# Patient Record
Sex: Male | Born: 1965 | Hispanic: No | Marital: Married | Smoking: Never smoker
Health system: Southern US, Community
[De-identification: ages and names within clinical notes are randomized; demographics above are authoritative.]

## PROBLEM LIST (undated history)

## (undated) DIAGNOSIS — Z973 Presence of spectacles and contact lenses: Secondary | ICD-10-CM

## (undated) DIAGNOSIS — G4733 Obstructive sleep apnea (adult) (pediatric): Secondary | ICD-10-CM

## (undated) DIAGNOSIS — E785 Hyperlipidemia, unspecified: Secondary | ICD-10-CM

## (undated) DIAGNOSIS — I10 Essential (primary) hypertension: Secondary | ICD-10-CM

## (undated) HISTORY — DX: Obstructive sleep apnea (adult) (pediatric): G47.33

## (undated) HISTORY — DX: Hyperlipidemia, unspecified: E78.5

## (undated) HISTORY — PX: BACK SURGERY: SHX140

## (undated) HISTORY — PX: UMBILICAL HERNIA REPAIR: SHX196

---
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
## 2002-01-18 ENCOUNTER — Ambulatory Visit (HOSPITAL_COMMUNITY): Admission: RE | Admit: 2002-01-18 | Discharge: 2002-01-18 | Payer: Self-pay | Admitting: Internal Medicine

## 2002-01-18 ENCOUNTER — Encounter: Payer: Self-pay | Admitting: Internal Medicine

## 2003-10-22 ENCOUNTER — Emergency Department (HOSPITAL_COMMUNITY): Admission: EM | Admit: 2003-10-22 | Discharge: 2003-10-22 | Payer: Self-pay | Admitting: Emergency Medicine

## 2005-01-20 ENCOUNTER — Encounter: Payer: Self-pay | Admitting: Pulmonary Disease

## 2005-01-20 ENCOUNTER — Ambulatory Visit: Admission: RE | Admit: 2005-01-20 | Discharge: 2005-01-20 | Payer: Self-pay | Admitting: Internal Medicine

## 2005-01-24 ENCOUNTER — Ambulatory Visit: Payer: Self-pay | Admitting: Pulmonary Disease

## 2005-03-06 ENCOUNTER — Ambulatory Visit: Payer: Self-pay | Admitting: Pulmonary Disease

## 2005-04-03 ENCOUNTER — Ambulatory Visit: Payer: Self-pay | Admitting: Pulmonary Disease

## 2010-03-13 ENCOUNTER — Encounter: Payer: Self-pay | Admitting: Pulmonary Disease

## 2010-04-04 ENCOUNTER — Ambulatory Visit: Payer: Self-pay | Admitting: Pulmonary Disease

## 2010-04-04 DIAGNOSIS — Z9989 Dependence on other enabling machines and devices: Secondary | ICD-10-CM | POA: Insufficient documentation

## 2010-04-04 DIAGNOSIS — G4733 Obstructive sleep apnea (adult) (pediatric): Secondary | ICD-10-CM | POA: Insufficient documentation

## 2010-04-04 DIAGNOSIS — E785 Hyperlipidemia, unspecified: Secondary | ICD-10-CM | POA: Insufficient documentation

## 2010-04-09 ENCOUNTER — Telehealth (INDEPENDENT_AMBULATORY_CARE_PROVIDER_SITE_OTHER): Payer: Self-pay | Admitting: *Deleted

## 2010-09-18 NOTE — Assessment & Plan Note (Signed)
Summary: consult for management of osa   Copy to:  Alan Armstrong Primary Provider/Referring Provider:  Carylon Armstrong  CC:  Sleep Consult.  History of Present Illness: The pt is a 45y/o male who comes in for management of osa.  I last saw the pt in 2006, where he was diagnosed with severe osa.  He had an AHI of 41/hr, and desat to 81%.  He was ultimately titrated to an optimal pressure of 12cm.  He has done very well since his last visit, and his compliance is excellent by download.  He denies any mask or pressure problems, and has kept up with his supplies and mask changes.  His bedpartner has not seen breakthru snoring or apneas.  His AHI was well controlled by his download as well.  He goes to bed at 10pm, and arises at 5:30 am to start his day.  He feels rested upon arising.  He has rare sleep pressure during the day with periods of inactivity, and is satisfied with his alertness and concentration with mundane tasks.  He denies any sleepiness while driving short or long distances.  His epworth score today is only 5.  Preventive Screening-Counseling & Management  Alcohol-Tobacco     Smoking Status: never  Current Medications (verified): 1)  Tricor 145 Mg Tabs (Fenofibrate) .... Take 1 Tablet By Mouth Once A Day  Allergies (verified): No Known Drug Allergies  Past History:  Past Medical History:   HYPERLIPIDEMIA (ICD-272.4) OBSTRUCTIVE SLEEP APNEA (ICD-327.23)--AHI 41/hr 2006    Past Surgical History: L arm surgery   Family History: Reviewed history and no changes required. none per pt  Social History: Reviewed history and no changes required. Patient never smoked.  pt is married and lives with wife Alan Armstrong pt does not have any children. Pt works as a Education officer, environmental. Smoking Status:  never  Review of Systems  The patient denies shortness of breath with activity, shortness of breath at rest, productive cough, non-productive cough, coughing up blood, chest pain,  irregular heartbeats, acid heartburn, indigestion, loss of appetite, weight change, abdominal pain, difficulty swallowing, sore throat, tooth/dental problems, headaches, nasal congestion/difficulty breathing through nose, sneezing, itching, ear ache, anxiety, depression, hand/feet swelling, joint stiffness or pain, rash, change in color of mucus, and fever.    Vital Signs:  Patient profile:   45 year old male Height:      68 inches Weight:      303.13 pounds BMI:     46.26 O2 Sat:      97 % on Room air Temp:     98.4 degrees F oral Pulse rate:   55 / minute BP sitting:   110 / 70  (left arm) Cuff size:   large  Vitals Entered By: Arman Filter LPN (April 04, 2010 11:25 AM)  O2 Flow:  Room air CC: Sleep Consult Comments Medications reviewed with patient Arman Filter LPN  April 04, 2010 11:25 AM    Physical Exam  General:  obese male in nad Eyes:  PERRLA and EOMI.   Nose:  mild septal deviation to left with no obstruction Mouth:  moderate elongation of soft palate and uvula, side wall narrowing due to soft tissue redundancy Neck:  no jvd, tmg, LN Lungs:  clear to auscultation Heart:  rrr, no mrg Abdomen:  soft and nontender, bs+ Extremities:  no edema or cyanosis  pulses intact distally. Neurologic:  alert and oriented, moves all 4.   Impression & Recommendations:  Problem # 1:  OBSTRUCTIVE SLEEP APNEA (ICD-327.23)  the pt has a history of severe osa, but is doing very well with cpap on optimal pressure.  He has documented compliance with the device, and no significant breakthru events.  He feels that he sleeps well, and denies alertness issues during the day.  His epworth score today is only 5.  I have encouraged him to work on weight loss, and to keep up with cpap supplies.  I will see him back in one year.    Medications Added to Medication List This Visit: 1)  Tricor 145 Mg Tabs (Fenofibrate) .... Take 1 tablet by mouth once a day  Other Orders: Consultation  Level IV (14782)  Patient Instructions: 1)  will send note to the [employer] outlining your condition. 2)  continue with cpap and work hard on weight loss 3)  followup with me in one year.

## 2010-09-18 NOTE — Consult Note (Signed)
Summary: Sleep Medicine  Sleep Medicine   Imported By: Lester Iola 04/10/2010 07:15:58  _____________________________________________________________________  External Attachment:    Type:   Image     Comment:   External Document

## 2010-09-18 NOTE — Letter (Signed)
Summary: [place] [employer]  [place] [employer]   Imported By: Sherian Rein 04/18/2010 12:08:52  _____________________________________________________________________  External Attachment:    Type:   Image     Comment:   External Document

## 2010-09-18 NOTE — Progress Notes (Signed)
Summary: letter for [employer] Note Call from Patient Call back at Home Phone [phone] Call back at [phone]   Caller: Patient Call For: clance Reason for Call: Talk to Nurse, Talk to Doctor Summary of Call: pt said [facility] was going to write him a letter for the [facility] re: his sleep apnea.  Have you finished it yet?(letter)?  If you have letter you can fax to [phone]. Initial call taken by: Eugene Gavia,  April 09, 2010 1:42 PM  Follow-up for Phone Call        Dr. Shelle Iron, per last OV note on 8.17.11, we were going to send letter to [facility] gaurd outlining pt's condition.  Pls advise status of this letter.  Thanks! Gweneth Dimitri RN  April 09, 2010 1:46 PM   Additional Follow-up for Phone Call Additional follow up Details #1::        this has been done.  megan, please call pt to come by and pick up.  needs to sign release of info Additional Follow-up by: Barbaraann Share MD,  April 10, 2010 10:02 AM    Additional Follow-up for Phone Call Additional follow up Details #2::    pt aware letter ready and to come to the office to pick up letter (which is sitting on my desk)  and sign a release of info.  Aundra Millet Reynolds LPN  April 10, 2010 10:15 AM

## 2011-01-04 NOTE — Consult Note (Signed)
NAME:  [name], REASON NO.:  [id number]   MEDICAL RECORD NO.:  [id number]                   PATIENT TYPE:  EMS   LOCATION:  ED                                   FACILITY:  [facility]   PHYSICIAN:  Alan Armstrong, M.D.              DATE OF BIRTH:  1965/12/24   DATE OF CONSULTATION:  DATE OF DISCHARGE:  10/22/2003                                   CONSULTATION   CONSULTING PHYSICIAN:  Alan Armstrong, M.D.   CHIEF COMPLAINT:  I hurt my ankle.   The patient twisted his right ankle approximately 8-10 days ago while at  work.  He has had pain and tenderness.  He thought it was getting better, it  slowly did, but then he developed some swelling proximal to the ankle  laterally.  It has gotten a lot worse in the last day or two and it was  very, very painful this morning.  He came to the emergency room.  X-rays  were taken.  These were negative.   PHYSICAL EXAMINATION:  EXTREMITIES:  He has got some slight swelling in an  area the size of approximately a nickel, an inch and a half above the medial  malleolus.  He has got pain and tenderness in the anterior talofibular  ligament and in this area.  There is no erythema, no purulence.  VITAL SIGNS:  He is afebrile.   X-rays are negative.   IMPRESSION:  Strained right ankle with some swelling, edema.   PLAN:  Aircast, contrast baths.  I will see him in the office on Thursday.  Prescription for Vicodin 5/500 given.  Told him to take Advil three tablets  three times a day.  Any difficulty call.      ___________________________________________                                            Teola Bradley, M.D.   JWK/MEDQ  D:  10/22/2003  T:  10/22/2003  Job:  [id number]

## 2011-01-04 NOTE — Procedures (Signed)
NAME:  ARICK, MARENO NO.:  [id number]   MEDICAL RECORD NO.:  [id number]          PATIENT TYPE:  OUT   LOCATION:  SLEEP LAB                     FACILITY:  [facility]   PHYSICIAN:  Marcelyn Bruins, M.D. [facility] DATE OF BIRTH:  [birth year]   DATE OF STUDY:  01/20/2005                              NOCTURNAL POLYSOMNOGRAM   REFERRING PHYSICIAN:  Kingsley Callander. Ouida Sills, M.D.   INDICATION FOR THE STUDY:  Hypersomnia with sleep apnea. Epworth score: 15.   SLEEP ARCHITECTURE:  The patient a total sleep time of 364 minutes with  decreased REM and slow wave sleep. Sleep onset latency was normal as was REM  onset. Sleep efficiency was 87%.   IMPRESSION:  1.  Severe obstructive sleep apnea/hypopnea syndrome with a respiratory      disturbance index of 41 events per hour and O2 desaturation as low as      81%. The events were clearly worse in the supine position and were      associated with very loud snoring. Treatment for this degree of sleep      apnea may include weight loss, upper airway surgery, oral appliance or      CPAP. CPAP coupled with weight loss is probably the most effective      treatment for this degree of sleep apnea.  2.  No clinically significant cardiac arrhythmias.     ______________________________  Suzzette Righter    KC/MEDQ  D:  01/24/2005 11:26:12  T:  01/24/2005 11:56:28  Job:  [id number]

## 2011-04-03 ENCOUNTER — Ambulatory Visit (INDEPENDENT_AMBULATORY_CARE_PROVIDER_SITE_OTHER): Payer: 59 | Admitting: Pulmonary Disease

## 2011-04-03 ENCOUNTER — Encounter: Payer: Self-pay | Admitting: Pulmonary Disease

## 2011-04-03 VITALS — BP 122/84 | HR 67 | Temp 98.1°F | Ht 68.0 in | Wt 313.0 lb

## 2011-04-03 DIAGNOSIS — G4733 Obstructive sleep apnea (adult) (pediatric): Secondary | ICD-10-CM

## 2011-04-03 NOTE — Progress Notes (Signed)
  Subjective:    Patient ID: Alan Armstrong, male    DOB: 10-30-1965, 45 y.o.   MRN: [id number]  HPI The patient comes in today for followup of his known severe sleep apnea.  He is being managed on CPAP therapy, and feels that he is doing well with the device.  He has no issues with pressure tolerance, but is having some leaking from a mask that is old.  He is due for a replacement.  Overall he feels that he is sleeping well with adequate daytime alertness, but does not feel as rested as he has in the past.  I have explained this may be from mask leaking.  He also has gained 10 pounds since the last visit.  Download from his CPAP machine today shows a 95% compliance greater than or equal to 4 hours.   Review of Systems  Constitutional: Negative for fever and unexpected weight change.  HENT: Negative for ear pain, nosebleeds, congestion, sore throat, rhinorrhea, sneezing, trouble swallowing, dental problem, postnasal drip and sinus pressure.   Eyes: Negative for redness and itching.  Respiratory: Negative for cough, chest tightness, shortness of breath and wheezing.   Cardiovascular: Negative for palpitations and leg swelling.  Gastrointestinal: Negative for nausea and vomiting.  Genitourinary: Negative for dysuria.  Musculoskeletal: Negative for joint swelling.  Skin: Negative for rash.  Neurological: Negative for headaches.  Hematological: Does not bruise/bleed easily.  Psychiatric/Behavioral: Negative for dysphoric mood. The patient is not nervous/anxious.        Objective:   Physical Exam Obese male in no acute distress No skin breakdown or pressure necrosis from CPAP mask Lower extremities without significant edema, no cyanosis noted Alert and oriented, moves all 4 extremities.       Assessment & Plan:

## 2011-04-03 NOTE — Patient Instructions (Signed)
Continue on CPAP, and keep up with mask changes and supplies Work on weight reduction Get new CPAP mask from your DME, and if you continue to feel that you are not sleeping as well as in the past, please contact us so that we can recheck your pressure needs. Follow up with me in one year.

## 2011-04-03 NOTE — Assessment & Plan Note (Signed)
The patient has been very compliant with his CPAP therapy per his download today.  Overall he feels that he is sleeping well, but has noticed a little more sleepiness most recently.  He has an aged CPAP mask, and feels that it is leaking.  I have asked him to get a new CPAP mask, and if he does not feel as rested as in the past, he is to contact us.  At that point would recheck his optimal pressure on automatic mode.  I've encouraged him to work aggressively on weight loss, and to followup with me in one year.

## 2011-04-25 ENCOUNTER — Encounter: Payer: Self-pay | Admitting: Pulmonary Disease

## 2011-06-10 ENCOUNTER — Telehealth: Payer: Self-pay | Admitting: Pulmonary Disease

## 2011-06-10 NOTE — Telephone Encounter (Signed)
LMTCB

## 2011-06-10 NOTE — Telephone Encounter (Signed)
Spoke with pt. He states that he has faxed over a letter for [facility] to sign stating that he is being treated here. He states wants to know if this has been received yet. Aundra Millet, have you seen this? Please advise and if not, will call him and have him refax. He is aware [facility] out of the office this wk.

## 2011-06-10 NOTE — Telephone Encounter (Signed)
Pt called back.  Wanting to know if we received the fax that was sent today - would like a call back today at [phone] in regards to whether or not this was received.  States this will be 4 pages - the first page is handwritten and the rest is a letter from the [employer]. Also, this letter is for the [employer].  He only has 30 days to get this taken care of, or he will loose his license.  He would appreciate if this can be taken care of ASAP when [facility] returns to the office next week.  Megan, pls advise if you have seen this or not.  Thanks!

## 2011-06-10 NOTE — Telephone Encounter (Signed)
Called and spoke with pt.  Informed him we did receive 4 page fax that he sent.  Pt aware [facility] out of office this week but will return next week and is requesting this be done asap or he could lose his job.  Informed pt I would let KC know.  Paperwork in [facility] very important look at folder.

## 2011-06-17 NOTE — Telephone Encounter (Signed)
Called and spoke with pt.  Pt states he is currently out of town and will have to check when he gets home to see if there was any other paperwork he had for [facility] and will call me back.  Will await for pt to call back.

## 2011-06-17 NOTE — Telephone Encounter (Signed)
Megan, the paperwork he provided has nothing for me to fill out.  Did he forget to bring this?

## 2011-06-20 NOTE — Telephone Encounter (Signed)
Pt called back & stated there is not a specific form, but a letter from [place].  Pt stated if you should have any questions to call him at [phone].  Antionette Fairy

## 2011-06-20 NOTE — Telephone Encounter (Signed)
Pt called back.  Informed him letter faxed to # pt provided. Nothing further needed.

## 2011-06-20 NOTE — Telephone Encounter (Signed)
Spoke with pt. He states that he just needs letter from [facility] stating that he is a patient here, his dx and how he has been doing with tx so far. He wants to have this faxed to him at his home - [phone]. KC, pls advise thanks!

## 2011-06-20 NOTE — Telephone Encounter (Signed)
LMOM for pt to call back to inform him letter completed and we will fax this for him.

## 2011-06-20 NOTE — Telephone Encounter (Signed)
Note written and put in triage.

## 2011-08-06 ENCOUNTER — Other Ambulatory Visit (HOSPITAL_COMMUNITY): Payer: Self-pay | Admitting: Internal Medicine

## 2011-08-06 ENCOUNTER — Ambulatory Visit (HOSPITAL_COMMUNITY): Admission: RE | Admit: 2011-08-06 | Payer: 59 | Source: Ambulatory Visit

## 2011-08-06 ENCOUNTER — Ambulatory Visit (HOSPITAL_COMMUNITY)
Admission: RE | Admit: 2011-08-06 | Discharge: 2011-08-06 | Disposition: A | Discharge status: Home / Self Care | Payer: 59 | Source: Ambulatory Visit | Attending: Internal Medicine | Admitting: Internal Medicine

## 2011-08-06 DIAGNOSIS — R079 Chest pain, unspecified: Secondary | ICD-10-CM | POA: Insufficient documentation

## 2011-12-02 ENCOUNTER — Encounter (HOSPITAL_COMMUNITY): Payer: Self-pay | Admitting: *Deleted

## 2011-12-02 ENCOUNTER — Other Ambulatory Visit (HOSPITAL_COMMUNITY): Payer: Self-pay | Admitting: Internal Medicine

## 2011-12-02 ENCOUNTER — Emergency Department (HOSPITAL_COMMUNITY): Payer: 59

## 2011-12-02 ENCOUNTER — Emergency Department (HOSPITAL_COMMUNITY)
Admission: EM | Admit: 2011-12-02 | Discharge: 2011-12-02 | Disposition: A | Discharge status: Home / Self Care | Payer: 59 | Attending: Emergency Medicine | Admitting: Emergency Medicine

## 2011-12-02 DIAGNOSIS — R071 Chest pain on breathing: Secondary | ICD-10-CM | POA: Insufficient documentation

## 2011-12-02 DIAGNOSIS — M5414 Radiculopathy, thoracic region: Secondary | ICD-10-CM

## 2011-12-02 DIAGNOSIS — G4733 Obstructive sleep apnea (adult) (pediatric): Secondary | ICD-10-CM | POA: Insufficient documentation

## 2011-12-02 DIAGNOSIS — R0789 Other chest pain: Secondary | ICD-10-CM

## 2011-12-02 MED ORDER — HYDROCODONE-ACETAMINOPHEN 5-325 MG PO TABS: 1.0000 | ORAL_TABLET | ORAL | Status: AC | PRN

## 2011-12-02 MED ORDER — ONDANSETRON 8 MG PO TBDP
8.0000 mg | ORAL_TABLET | Freq: Once | ORAL | Status: AC
  Administered 2011-12-02: 8 mg via ORAL
  Filled 2011-12-02: qty 1

## 2011-12-02 MED ORDER — ORPHENADRINE CITRATE ER 100 MG PO TB12: 100.0000 mg | ORAL_TABLET | Freq: Two times a day (BID) | ORAL | Status: AC

## 2011-12-02 MED ORDER — HYDROMORPHONE HCL PF 1 MG/ML IJ SOLN
1.0000 mg | Freq: Once | INTRAMUSCULAR | Status: AC
  Administered 2011-12-02: 1 mg via INTRAMUSCULAR
  Filled 2011-12-02: qty 1

## 2011-12-02 NOTE — ED Notes (Addendum)
Pain rt lat chest , onset Friday,  Hx of fx rib in  Dec.  No recent injury.  NO cough or cold sx.  Increased pain with movement. Or cough

## 2011-12-02 NOTE — ED Notes (Signed)
Pt presents with persistent rt rib pain, after sustaining a fracture of 5 th and 6 th rt rib in December 2012. Pt states is unsure how he obtained the original fracture. Pt denies injury/trauma at this time,however Pt is a Designer, fashion/clothing by trade. Pt denies SOB, vertigo or any other symptoms except discomfort. Pt does have a slightly darkened area at said site of pain as opposed to the left side however this does not appear to be echomoysis

## 2011-12-02 NOTE — ED Notes (Signed)
Patient transported to X-ray Pt ambulates without assistance.

## 2011-12-03 ENCOUNTER — Ambulatory Visit (HOSPITAL_COMMUNITY)
Admission: RE | Admit: 2011-12-03 | Discharge: 2011-12-03 | Disposition: A | Discharge status: Home / Self Care | Payer: 59 | Source: Ambulatory Visit | Attending: Internal Medicine | Admitting: Internal Medicine

## 2011-12-03 DIAGNOSIS — M546 Pain in thoracic spine: Secondary | ICD-10-CM | POA: Insufficient documentation

## 2011-12-03 DIAGNOSIS — M5124 Other intervertebral disc displacement, thoracic region: Secondary | ICD-10-CM | POA: Insufficient documentation

## 2011-12-03 DIAGNOSIS — M5380 Other specified dorsopathies, site unspecified: Secondary | ICD-10-CM | POA: Insufficient documentation

## 2011-12-03 DIAGNOSIS — M48061 Spinal stenosis, lumbar region without neurogenic claudication: Secondary | ICD-10-CM | POA: Insufficient documentation

## 2011-12-03 DIAGNOSIS — M5414 Radiculopathy, thoracic region: Secondary | ICD-10-CM

## 2011-12-06 NOTE — ED Provider Notes (Signed)
History     CSN: [id number]  Arrival date & time 12/02/11  1049   First MD Initiated Contact with Patient 12/02/11 1234      Chief Complaint  Patient presents with  . Chest Pain    (Consider location/radiation/quality/duration/timing/severity/associated sxs/prior treatment) HPI Comments: Alan Armstrong presents with intermittent,  Sharp right chest wall pain since he fractured 2 ribs 4 months ago by falling off a roof (works as a Designer, fashion/clothing).  He denies new injury.  Pain is worse with palpation,  Twisting,  Bending and cough or sneezing and has been flared up for the past 4 days.  He has found no alleviators for the pain.  He denies fevers,  Chills, or shortness of breath.  He also denies any swelling or pain in his lower extremities.    Patient is a 46 y.o. male presenting with chest pain. The history is provided by the patient.  Chest Pain Pertinent negatives for primary symptoms include no fever, no shortness of breath, no abdominal pain, no nausea and no dizziness.  Pertinent negatives for associated symptoms include no numbness and no weakness.     Past Medical History  Diagnosis Date  . Other and unspecified hyperlipidemia   . OSA (obstructive sleep apnea)     Past Surgical History  Procedure Date  . Arm surgery     left    History reviewed. No pertinent family history.  History  Substance Use Topics  . Smoking status: Never Smoker   . Smokeless tobacco: Not on file  . Alcohol Use: Yes      Review of Systems  Constitutional: Negative for fever.  HENT: Negative for congestion, sore throat and neck pain.   Eyes: Negative.   Respiratory: Negative for chest tightness and shortness of breath.   Cardiovascular: Positive for chest pain.  Gastrointestinal: Negative for nausea and abdominal pain.  Genitourinary: Negative.   Musculoskeletal: Negative for joint swelling and arthralgias.  Skin: Negative.  Negative for rash and wound.  Neurological: Negative for  dizziness, weakness, light-headedness, numbness and headaches.  Hematological: Negative.     Allergies  Review of patient's allergies indicates no known allergies.  Home Medications   Current Outpatient Rx  Name Route Sig Dispense Refill  . DICLOFENAC SODIUM 75 MG PO TBEC Oral Take 75 mg by mouth 2 (two) times daily.    . FENOFIBRATE 160 MG PO TABS Oral Take 160 mg by mouth at bedtime.     . ADULT MULTIVITAMIN W/MINERALS CH Oral Take 1 tablet by mouth daily.    . OMEGA-3-ACID ETHYL ESTERS 1 G PO CAPS Oral Take 1 g by mouth daily.    Marland Kitchen HYDROCODONE-ACETAMINOPHEN 5-325 MG PO TABS Oral Take 1 tablet by mouth every 4 (four) hours as needed for pain. 15 tablet 0  . ORPHENADRINE CITRATE ER 100 MG PO TB12 Oral Take 1 tablet (100 mg total) by mouth 2 (two) times daily. 20 tablet 0    BP 126/71  Pulse 53  Temp(Src) 98 F (36.7 C) (Oral)  Resp 18  Ht 5\' 8"  (1.727 m)  Wt 299 lb (135.626 kg)  BMI 45.46 kg/m2  SpO2 98%  Physical Exam  Nursing note and vitals reviewed. Constitutional: He appears well-developed and well-nourished.  HENT:  Head: Normocephalic and atraumatic.  Neck: Normal range of motion.  Cardiovascular: Normal rate, regular rhythm, normal heart sounds and intact distal pulses.   Pulmonary/Chest: Effort normal and breath sounds normal. He has no wheezes. He exhibits tenderness.  ttp right lateral rib cage.  Abdominal: Soft. Bowel sounds are normal. There is no tenderness.  Musculoskeletal: Normal range of motion. He exhibits no edema and no tenderness.       No swelling,  Cords,  Edema in lower extremities,  Neg Homans sign.  Neurological: He is alert.  Skin: Skin is warm and dry.  Psychiatric: He has a normal mood and affect.    ED Course  Procedures (including critical care time)  Labs Reviewed - No data to display No results found.   1. Acute chest wall pain       MDM  Reproducible pain with ROM and palpation.  VSS,  Perc negative,  Doubt PE.  cxr  reviewed with no evidence of pulmonary source for pain.  Norflex,  norco prescribed for pain and possible muscle spasm as source of pain.  Heat therapy recommended.        Candis Musa, PA 12/06/11 2340

## 2011-12-07 NOTE — ED Provider Notes (Signed)
Medical screening examination/treatment/procedure(s) were performed by non-physician practitioner and as supervising physician I was immediately available for consultation/collaboration.  Donnetta Hutching, MD 12/07/11 [phone]

## 2012-04-02 ENCOUNTER — Ambulatory Visit: Payer: 59 | Admitting: Pulmonary Disease

## 2012-04-13 ENCOUNTER — Ambulatory Visit: Payer: 59 | Admitting: Pulmonary Disease

## 2012-05-04 ENCOUNTER — Ambulatory Visit (INDEPENDENT_AMBULATORY_CARE_PROVIDER_SITE_OTHER): Payer: 59 | Admitting: Pulmonary Disease

## 2012-05-04 ENCOUNTER — Encounter: Payer: Self-pay | Admitting: Pulmonary Disease

## 2012-05-04 VITALS — BP 130/88 | HR 74 | Temp 98.3°F | Ht 68.0 in | Wt 301.6 lb

## 2012-05-04 DIAGNOSIS — G4733 Obstructive sleep apnea (adult) (pediatric): Secondary | ICD-10-CM

## 2012-05-04 NOTE — Patient Instructions (Addendum)
Continue with cpap, and keep up with mask changes and supplies Continue to work on weight reduction.  You are doing well. followup with me in 12mos.

## 2012-05-04 NOTE — Assessment & Plan Note (Signed)
The patient is doing very well on CPAP, and feels that he is sleeping well with excellent daytime alertness.  He has also lost weight since the last visit, and I have encouraged him to continue doing so.  If he is doing well, we'll see him back in one year.  I have also given him a note outlining his good compliance with CPAP.

## 2012-05-04 NOTE — Progress Notes (Signed)
  Subjective:    Patient ID: Alan Armstrong, male    DOB: Dec 21, 1965, 46 y.o.   MRN: [id number]  HPI The patient comes in today for followup of his known obstructive sleep apnea.  He is wearing CPAP very compliantly by his download, and denies any issues with mask leaks or pressure intolerance.  He feels that he is sleeping well, and is satisfied with his daytime alertness.  He denies any significant inappropriate daytime sleepiness.   Review of Systems  Constitutional: Negative for fever and unexpected weight change.  HENT: Negative for ear pain, nosebleeds, congestion, sore throat, rhinorrhea, sneezing, trouble swallowing, dental problem, postnasal drip and sinus pressure.   Eyes: Negative for redness and itching.  Respiratory: Negative for cough, chest tightness, shortness of breath and wheezing.   Cardiovascular: Negative for palpitations and leg swelling.  Gastrointestinal: Negative for nausea and vomiting.  Genitourinary: Negative for dysuria.  Musculoskeletal: Negative for joint swelling.  Skin: Negative for rash.  Neurological: Negative for headaches.  Hematological: Does not bruise/bleed easily.  Psychiatric/Behavioral: Negative for dysphoric mood. The patient is not nervous/anxious.        Objective:   Physical Exam Obese male in no acute distress Nose without purulence or discharge noted No skin breakdown or pressure necrosis from the CPAP mask Lower extremities without significant edema, no cyanosis Alert and oriented, moves all 4 extremities.       Assessment & Plan:

## 2013-03-16 ENCOUNTER — Telehealth: Payer: Self-pay | Admitting: Pulmonary Disease

## 2013-03-16 NOTE — Telephone Encounter (Signed)
Spoke to pt. States that he is falling asleep all day long. This is not normal for him. Agreed to see another MD. Appointment has been made with VS on 03/18/2013 at 2pm.

## 2013-03-18 ENCOUNTER — Encounter: Payer: Self-pay | Admitting: Pulmonary Disease

## 2013-03-18 ENCOUNTER — Ambulatory Visit (INDEPENDENT_AMBULATORY_CARE_PROVIDER_SITE_OTHER): Payer: 59 | Admitting: Pulmonary Disease

## 2013-03-18 VITALS — BP 132/90 | HR 64 | Temp 97.8°F | Ht 68.0 in | Wt 305.0 lb

## 2013-03-18 DIAGNOSIS — G4733 Obstructive sleep apnea (adult) (pediatric): Secondary | ICD-10-CM

## 2013-03-18 NOTE — Progress Notes (Signed)
Chief Complaint  Patient presents with  . Sleep Apnea    Alan pt. States that he is having problems with day time sleepiness. This is not common for him. Currently using CPAP machine.    History of Present Illness: Alan Armstrong is a 47 y.o. male with OSA.  He is followed by Dr. Shelle Iron.  He has been using CPAP 12 cm H2O.  He has gained 20 lbs since initial set up.  He has noticed more trouble feeling sleepy during the day.  He goes to bed at 11 pm and wakes up at 530 am.  He does not use anything to help him sleep or stay awake.  This has been his usual sleep pattern.  He has nasal mask, and uses CPAP every night.  He is not having trouble with mask fit or leak.  He denies mouth dryness, sleep walking/talking, or leg symptoms.  His wife reports that he does not snore when using CPAP.  He does not take naps.  He had to stop and sleep recently when driving >> never had this happen since starting CPAP.  He broke a finger in his right hand in April, and had trouble sleeping due to pain and numbness. He has been taking NSAID meds, and this has helped with pain and ability to sleepy. He still feels sleepy.  Alan Armstrong  has a past medical history of Other and unspecified hyperlipidemia and OSA (obstructive sleep apnea).  Alan Armstrong  has past surgical history that includes arm surgery.  Prior to Admission medications   Medication Sig Start Date End Date Taking? Authorizing Provider  fenofibrate 160 MG tablet Take 160 mg by mouth at bedtime.    Yes Historical Provider, MD  naproxen sodium (ALEVE) 220 MG tablet Take 220 mg by mouth 2 (two) times daily with a meal.   Yes Historical Provider, MD  pyridOXINE (VITAMIN B-6) 100 MG tablet Take 200 mg by mouth daily.   Yes Historical Provider, MD    No Known Allergies   Physical Exam:  General - No distress ENT - No sinus tenderness, MP 4, no oral exudate, no LAN Cardiac - s1s2 regular, no murmur Chest - No  wheeze/rales/dullness Back - No focal tenderness Abd - Soft, non-tender Ext - No edema, splint on Rt hand Neuro - Normal strength Skin - No rashes Psych - normal mood, and behavior   Assessment/Plan:  Alan Helling, MD [facility] Pager:  [phone]

## 2013-03-18 NOTE — Patient Instructions (Signed)
Will arrange for Auto CPAP settings and call with report from CPAP download Follow up with Dr. Shelle Iron in September

## 2013-04-13 ENCOUNTER — Telehealth: Payer: Self-pay | Admitting: Pulmonary Disease

## 2013-04-13 NOTE — Telephone Encounter (Signed)
Auto CPAP 03/18/13 to 04/04/13 >> Used on 18 of 18 nights with average 7 hrs 38 min.  Average AHI 5.3 with median CPAP 10 cm H2O and 95 th percentile CPAP 12 cm H2O.  Will have my nurse inform pt that CPAP report looks good.  No change to current set up.  Will route information to Dr. Shelle Iron to review.

## 2013-04-13 NOTE — Telephone Encounter (Signed)
He needs ov with me to review things, and figure out why sleepy.

## 2013-04-13 NOTE — Telephone Encounter (Signed)
Pt is aware of download results. ROV has been scheduled for 04/15/13 at 11a with KC.

## 2013-04-14 ENCOUNTER — Other Ambulatory Visit (HOSPITAL_COMMUNITY): Payer: Self-pay | Admitting: Internal Medicine

## 2013-04-14 ENCOUNTER — Ambulatory Visit (HOSPITAL_COMMUNITY)
Admission: RE | Admit: 2013-04-14 | Discharge: 2013-04-14 | Disposition: A | Discharge status: Home / Self Care | Payer: 59 | Source: Ambulatory Visit | Attending: Internal Medicine | Admitting: Internal Medicine

## 2013-04-14 DIAGNOSIS — R0781 Pleurodynia: Secondary | ICD-10-CM

## 2013-04-14 DIAGNOSIS — W19XXXA Unspecified fall, initial encounter: Secondary | ICD-10-CM | POA: Insufficient documentation

## 2013-04-14 DIAGNOSIS — R079 Chest pain, unspecified: Secondary | ICD-10-CM | POA: Insufficient documentation

## 2013-04-14 DIAGNOSIS — S2249XA Multiple fractures of ribs, unspecified side, initial encounter for closed fracture: Secondary | ICD-10-CM | POA: Insufficient documentation

## 2013-04-15 ENCOUNTER — Ambulatory Visit (INDEPENDENT_AMBULATORY_CARE_PROVIDER_SITE_OTHER): Payer: 59 | Admitting: Pulmonary Disease

## 2013-04-15 ENCOUNTER — Encounter: Payer: Self-pay | Admitting: Pulmonary Disease

## 2013-04-15 VITALS — BP 130/72 | HR 57 | Temp 97.6°F | Ht 68.0 in | Wt 310.8 lb

## 2013-04-15 DIAGNOSIS — G4733 Obstructive sleep apnea (adult) (pediatric): Secondary | ICD-10-CM

## 2013-04-15 NOTE — Progress Notes (Signed)
  Subjective:    Patient ID: Alan Armstrong, male    DOB: 08/22/1965, 47 y.o.   MRN: [id number]  HPI The patient comes in today for followup of his obstructive sleep apnea.  He is wearing CPAP compliantly by his last downloaded, but recently had some issues with daytime sleepiness.  He used an automatic device for a few weeks which showed his optimal pressure to be 12 cm.  The patient felt he did much better on the automatic device, and is interested in changing to this.  Currently he feels that he is sleeping much better, and denies daytime sleepiness currently.   Review of Systems  Constitutional: Negative for fever and unexpected weight change.  HENT: Negative for ear pain, nosebleeds, congestion, sore throat, rhinorrhea, sneezing, trouble swallowing, dental problem, postnasal drip and sinus pressure.   Eyes: Negative for redness and itching.  Respiratory: Negative for cough, chest tightness, shortness of breath and wheezing.   Cardiovascular: Negative for palpitations and leg swelling.  Gastrointestinal: Negative for nausea and vomiting.  Genitourinary: Negative for dysuria.  Musculoskeletal: Negative for joint swelling.  Skin: Negative for rash.  Neurological: Negative for headaches.  Hematological: Does not bruise/bleed easily.  Psychiatric/Behavioral: Negative for dysphoric mood. The patient is not nervous/anxious.        Objective:   Physical Exam Obese male in no acute distress Nose without purulence or discharge noted No skin breakdown or pressure necrosis from a CPAP mask Neck without lymphadenopathy or thyromegaly Lower extremities with minimal edema, no cyanosis Alert and oriented, moves all 4 extremities.       Assessment & Plan:

## 2013-04-15 NOTE — Assessment & Plan Note (Signed)
The patient initially had issues with daytime sleepiness despite wearing CPAP, but did very well with his sleep and improved daytime alertness on the automatic setting.  His current download shows excellent compliance, and he feels that his sleepiness is much improved.  He denies daytime sleepiness at this time.  We'll try and get him an automatic device since he did better on this, but if not able, will set his current machine on 12 cm as determined by his recent automatic download.  I've also encouraged him to work aggressively on weight loss

## 2013-04-15 NOTE — Patient Instructions (Addendum)
Will see if we can get you a new machine set on auto.  If not, will have your home care company check your machine, and also set on 12cm. Work on weight loss followup with me in one year if doing well.

## 2013-04-26 ENCOUNTER — Ambulatory Visit: Payer: 59 | Admitting: Pulmonary Disease

## 2013-04-28 ENCOUNTER — Ambulatory Visit: Payer: 59 | Admitting: Pulmonary Disease

## 2013-05-04 ENCOUNTER — Ambulatory Visit: Payer: 59 | Admitting: Pulmonary Disease

## 2013-06-03 ENCOUNTER — Other Ambulatory Visit: Payer: Self-pay | Admitting: Orthopedic Surgery

## 2013-09-07 ENCOUNTER — Encounter (HOSPITAL_BASED_OUTPATIENT_CLINIC_OR_DEPARTMENT_OTHER): Payer: Self-pay | Admitting: *Deleted

## 2013-09-07 NOTE — Progress Notes (Signed)
No labs needed-will bring cpap and will use post op Wife nurse anesthetist [facility]

## 2013-09-10 ENCOUNTER — Encounter (HOSPITAL_BASED_OUTPATIENT_CLINIC_OR_DEPARTMENT_OTHER)
Admission: RE | Disposition: A | Discharge status: Home / Self Care | Payer: Self-pay | Source: Ambulatory Visit | Attending: Orthopedic Surgery

## 2013-09-10 ENCOUNTER — Encounter (HOSPITAL_BASED_OUTPATIENT_CLINIC_OR_DEPARTMENT_OTHER): Payer: Self-pay

## 2013-09-10 ENCOUNTER — Ambulatory Visit (HOSPITAL_BASED_OUTPATIENT_CLINIC_OR_DEPARTMENT_OTHER)
Admission: RE | Admit: 2013-09-10 | Discharge: 2013-09-10 | Disposition: A | Discharge status: Home / Self Care | Payer: 59 | Source: Ambulatory Visit | Attending: Orthopedic Surgery | Admitting: Orthopedic Surgery

## 2013-09-10 ENCOUNTER — Ambulatory Visit (HOSPITAL_BASED_OUTPATIENT_CLINIC_OR_DEPARTMENT_OTHER): Payer: 59 | Admitting: Anesthesiology

## 2013-09-10 ENCOUNTER — Encounter (HOSPITAL_BASED_OUTPATIENT_CLINIC_OR_DEPARTMENT_OTHER): Payer: 59 | Admitting: Anesthesiology

## 2013-09-10 DIAGNOSIS — Z6841 Body Mass Index (BMI) 40.0 and over, adult: Secondary | ICD-10-CM | POA: Insufficient documentation

## 2013-09-10 DIAGNOSIS — G5600 Carpal tunnel syndrome, unspecified upper limb: Secondary | ICD-10-CM | POA: Insufficient documentation

## 2013-09-10 DIAGNOSIS — G4733 Obstructive sleep apnea (adult) (pediatric): Secondary | ICD-10-CM | POA: Insufficient documentation

## 2013-09-10 HISTORY — PX: CARPAL TUNNEL RELEASE: SHX101

## 2013-09-10 HISTORY — DX: Presence of spectacles and contact lenses: Z97.3

## 2013-09-10 SURGERY — CARPAL TUNNEL RELEASE
Anesthesia: Monitor Anesthesia Care | Site: Wrist | Laterality: Right

## 2013-09-10 MED ORDER — LIDOCAINE HCL (PF) 1 % IJ SOLN
INTRAMUSCULAR | Status: DC | PRN
  Administered 2013-09-10: 10 mL

## 2013-09-10 MED ORDER — FENTANYL CITRATE 0.05 MG/ML IJ SOLN
INTRAMUSCULAR | Status: DC | PRN
  Administered 2013-09-10 (×2): 50 ug via INTRAVENOUS

## 2013-09-10 MED ORDER — HYDROCODONE-ACETAMINOPHEN 5-325 MG PO TABS: 2.0000 | ORAL_TABLET | Freq: Four times a day (QID) | ORAL | Status: DC | PRN

## 2013-09-10 MED ORDER — LIDOCAINE HCL (PF) 1 % IJ SOLN
INTRAMUSCULAR | Status: AC
  Filled 2013-09-10: qty 30

## 2013-09-10 MED ORDER — FENTANYL CITRATE 0.05 MG/ML IJ SOLN
INTRAMUSCULAR | Status: AC
  Filled 2013-09-10: qty 2

## 2013-09-10 MED ORDER — ONDANSETRON HCL 4 MG/2ML IJ SOLN: 4.0000 mg | Freq: Four times a day (QID) | INTRAMUSCULAR | Status: DC | PRN

## 2013-09-10 MED ORDER — SODIUM BICARBONATE 4 % IV SOLN
INTRAVENOUS | Status: AC
  Filled 2013-09-10: qty 5

## 2013-09-10 MED ORDER — OXYCODONE HCL 5 MG/5ML PO SOLN: 5.0000 mg | Freq: Once | ORAL | Status: DC | PRN

## 2013-09-10 MED ORDER — ONDANSETRON HCL 4 MG/2ML IJ SOLN
INTRAMUSCULAR | Status: DC | PRN
  Administered 2013-09-10: 4 mg via INTRAVENOUS

## 2013-09-10 MED ORDER — OXYCODONE HCL 5 MG PO TABS: 5.0000 mg | ORAL_TABLET | Freq: Once | ORAL | Status: DC | PRN

## 2013-09-10 MED ORDER — BUPIVACAINE HCL (PF) 0.25 % IJ SOLN
INTRAMUSCULAR | Status: DC | PRN
  Administered 2013-09-10: 10 mL

## 2013-09-10 MED ORDER — FENTANYL CITRATE 0.05 MG/ML IJ SOLN: 50.0000 ug | INTRAMUSCULAR | Status: DC | PRN

## 2013-09-10 MED ORDER — LACTATED RINGERS IV SOLN
INTRAVENOUS | Status: DC
  Administered 2013-09-10: 08:00:00 via INTRAVENOUS

## 2013-09-10 MED ORDER — MIDAZOLAM HCL 5 MG/5ML IJ SOLN
INTRAMUSCULAR | Status: DC | PRN
  Administered 2013-09-10 (×2): 1 mg via INTRAVENOUS

## 2013-09-10 MED ORDER — MIDAZOLAM HCL 2 MG/2ML IJ SOLN
INTRAMUSCULAR | Status: AC
  Filled 2013-09-10: qty 2

## 2013-09-10 MED ORDER — SODIUM BICARBONATE 4 % IV SOLN
INTRAVENOUS | Status: DC | PRN
  Administered 2013-09-10: 2 mL via INTRAVENOUS

## 2013-09-10 MED ORDER — FENTANYL CITRATE 0.05 MG/ML IJ SOLN: 25.0000 ug | INTRAMUSCULAR | Status: DC | PRN

## 2013-09-10 MED ORDER — BUPIVACAINE HCL (PF) 0.25 % IJ SOLN
INTRAMUSCULAR | Status: AC
  Filled 2013-09-10: qty 30

## 2013-09-10 MED ORDER — PROPOFOL 10 MG/ML IV EMUL
INTRAVENOUS | Status: AC
  Filled 2013-09-10: qty 50

## 2013-09-10 MED ORDER — MIDAZOLAM HCL 2 MG/2ML IJ SOLN: 1.0000 mg | INTRAMUSCULAR | Status: DC | PRN

## 2013-09-10 SURGICAL SUPPLY — 50 items
BANDAGE ELASTIC 3 VELCRO ST LF (GAUZE/BANDAGES/DRESSINGS) ×2 IMPLANT
BLADE CARPAL TUNNEL SNGL USE (BLADE) ×2 IMPLANT
BLADE SURG 15 STRL LF DISP TIS (BLADE) ×2 IMPLANT
BLADE SURG 15 STRL SS (BLADE) ×4
BNDG CONFORM 3 STRL LF (GAUZE/BANDAGES/DRESSINGS) ×2 IMPLANT
BRUSH SCRUB EZ PLAIN DRY (MISCELLANEOUS) ×2 IMPLANT
CORDS BIPOLAR (ELECTRODE) ×2 IMPLANT
COVER MAYO STAND STRL (DRAPES) ×2 IMPLANT
COVER TABLE BACK 60X90 (DRAPES) ×2 IMPLANT
CUFF TOURNIQUET SINGLE 18IN (TOURNIQUET CUFF) IMPLANT
CUFF TOURNIQUET SINGLE 24IN (TOURNIQUET CUFF) ×1 IMPLANT
DRAIN PENROSE 1/4X12 LTX STRL (WOUND CARE) IMPLANT
DRAPE EXTREMITY T 121X128X90 (DRAPE) ×2 IMPLANT
DRAPE SURG 17X23 STRL (DRAPES) ×2 IMPLANT
DRSG EMULSION OIL 3X3 NADH (GAUZE/BANDAGES/DRESSINGS) ×2 IMPLANT
GAUZE SPONGE 4X4 16PLY XRAY LF (GAUZE/BANDAGES/DRESSINGS) IMPLANT
GLOVE BIO SURGEON STRL SZ 6.5 (GLOVE) ×1 IMPLANT
GLOVE BIOGEL M STRL SZ7.5 (GLOVE) ×2 IMPLANT
GLOVE BIOGEL PI IND STRL 7.0 (GLOVE) IMPLANT
GLOVE BIOGEL PI INDICATOR 7.0 (GLOVE) ×1
GLOVE EPREMIER NITRL EXT CFF L (GLOVE) IMPLANT
GLOVE EXAM NITRILE EXT CFF LRG (GLOVE) ×2 IMPLANT
GLOVE SS BIOGEL STRL SZ 8 (GLOVE) ×1 IMPLANT
GLOVE SUPERSENSE BIOGEL SZ 8 (GLOVE) ×1
GOWN STRL REUS W/ TWL LRG LVL3 (GOWN DISPOSABLE) ×1 IMPLANT
GOWN STRL REUS W/ TWL XL LVL3 (GOWN DISPOSABLE) ×1 IMPLANT
GOWN STRL REUS W/TWL LRG LVL3 (GOWN DISPOSABLE) ×4
GOWN STRL REUS W/TWL XL LVL3 (GOWN DISPOSABLE) ×2
LOOP VESSEL MAXI BLUE (MISCELLANEOUS) IMPLANT
NDL HYPO 25X1 1.5 SAFETY (NEEDLE) ×2 IMPLANT
NDL SAFETY ECLIPSE 18X1.5 (NEEDLE) ×1 IMPLANT
NEEDLE HYPO 18GX1.5 SHARP (NEEDLE) ×2
NEEDLE HYPO 22GX1.5 SAFETY (NEEDLE) IMPLANT
NEEDLE HYPO 25X1 1.5 SAFETY (NEEDLE) ×4 IMPLANT
NS IRRIG 1000ML POUR BTL (IV SOLUTION) ×2 IMPLANT
PACK BASIN DAY SURGERY FS (CUSTOM PROCEDURE TRAY) ×2 IMPLANT
PAD ALCOHOL SWAB (MISCELLANEOUS) ×16 IMPLANT
PAD CAST 3X4 CTTN HI CHSV (CAST SUPPLIES) ×2 IMPLANT
PADDING CAST ABS 4INX4YD NS (CAST SUPPLIES) ×1
PADDING CAST ABS COTTON 4X4 ST (CAST SUPPLIES) ×1 IMPLANT
PADDING CAST COTTON 3X4 STRL (CAST SUPPLIES) ×4
SPONGE GAUZE 4X4 12PLY (GAUZE/BANDAGES/DRESSINGS) ×1 IMPLANT
STOCKINETTE 4X48 STRL (DRAPES) ×2 IMPLANT
SUT PROLENE 4 0 PS 2 18 (SUTURE) ×2 IMPLANT
SYR BULB 3OZ (MISCELLANEOUS) ×2 IMPLANT
SYR CONTROL 10ML LL (SYRINGE) ×4 IMPLANT
TOWEL OR 17X24 6PK STRL BLUE (TOWEL DISPOSABLE) ×3 IMPLANT
TOWEL OR NON WOVEN STRL DISP B (DISPOSABLE) ×3 IMPLANT
TRAY DSU PREP LF (CUSTOM PROCEDURE TRAY) ×2 IMPLANT
UNDERPAD 30X30 INCONTINENT (UNDERPADS AND DIAPERS) ×2 IMPLANT

## 2013-09-10 NOTE — Op Note (Signed)
See dictation [phone] Mosie Epstein.D.

## 2013-09-10 NOTE — Discharge Instructions (Signed)
Elevate move and massage your fingers  Call for any problems  Keep bandage clean and dry.  Call for any problems.  No smoking.  Criteria for driving a car: you should be off your pain medicine for 7-8 hours, able to drive one handed(confident), thinking clearly and feeling able in your judgement to drive. Continue elevation as it will decrease swelling.  If instructed by MD move your fingers within the confines of the bandage/splint.  Use ice if instructed by your MD. Call immediately for any sudden loss of feeling in your hand/arm or change in functional abilities of the extremity.  We recommend that you to take vitamin C 1000 mg a day to promote healing we also recommend that if you require her pain medicine that he take a stool softener to prevent constipation as most pain medicines will have constipation side effects. We recommend either Peri-Colace or Senokot and recommend that you also consider adding MiraLAX to prevent the constipation affects from pain medicine if you are required to use them. These medicines are over the counter and maybe purchased at a local pharmacy.   We recommend Vit B6 200 mg a day to promote nerve healing    Post Anesthesia Home Care Instructions  Activity: Get plenty of rest for the remainder of the day. A responsible adult should stay with you for 24 hours following the procedure.  For the next 24 hours, DO NOT: -Drive a car -Paediatric nurse -Drink alcoholic beverages -Take any medication unless instructed by your physician -Make any legal decisions or sign important papers.  Meals: Start with liquid foods such as gelatin or soup. Progress to regular foods as tolerated. Avoid greasy, spicy, heavy foods. If nausea and/or vomiting occur, drink only clear liquids until the nausea and/or vomiting subsides. Call your physician if vomiting continues.  Special Instructions/Symptoms: Your throat may feel dry or sore from the anesthesia or the breathing tube  placed in your throat during surgery. If this causes discomfort, gargle with warm salt water. The discomfort should disappear within 24 hours.

## 2013-09-10 NOTE — Transfer of Care (Signed)
Immediate Anesthesia Transfer of Care Note  Patient: Alan Armstrong  Procedure(s) Performed: Procedure(s): RIGHT LIMITED OPEN CARPAL TUNNEL RELEASE (Right)  Patient Location: PACU  Anesthesia Type:MAC  Level of Consciousness: awake, alert  and oriented  Airway & Oxygen Therapy: Patient Spontanous Breathing  Post-op Assessment: Report given to PACU RN  Post vital signs: Reviewed and stable  Complications: No apparent anesthesia complications

## 2013-09-10 NOTE — H&P (Signed)
Alan Armstrong is an 48 y.o. male.   Chief Complaint: R CTS HPI: Marland KitchenMarland KitchenPatient presents for evaluation and treatment of the of their upper extremity predicament. The patient denies neck back chest or of abdominal pain. The patient notes that they have no lower extremity problems. The patient from primarily complains of the upper extremity pain noted.  Presents for R CTR  Past Medical History  Diagnosis Date  . Other and unspecified hyperlipidemia   . OSA (obstructive sleep apnea)     uses a cpap  . Wears glasses     Past Surgical History  Procedure Laterality Date  . Arm surgery  1987    left-fx radius    History reviewed. No pertinent family history. Social History:  reports that he has never smoked. He does not have any smokeless tobacco history on file. He reports that he drinks alcohol. He reports that he does not use illicit drugs.  Allergies: No Known Allergies  Medications Prior to Admission  Medication Sig Dispense Refill  . fenofibrate 160 MG tablet Take 160 mg by mouth at bedtime.       . naproxen sodium (ALEVE) 220 MG tablet Take 220 mg by mouth 2 (two) times daily with a meal.        No results found for this or any previous visit (from the past 48 hour(s)). No results found.  Review of Systems  Constitutional: Negative.   HENT: Negative.   Respiratory: Negative.   Cardiovascular: Negative.   Gastrointestinal: Negative.   Skin: Negative.   Neurological: Negative.   Endo/Heme/Allergies: Negative.     Blood pressure 129/86, pulse 61, temperature 98.1 F (36.7 C), temperature source Oral, resp. rate 20, height 5\' 8"  (1.727 m), weight 143.79 kg (317 lb), SpO2 96.00%. Physical Exam R CTS   .Marland KitchenThe patient is alert and oriented in no acute distress the patient complains of pain in the affected upper extremity.  The patient is noted to have a normal HEENT exam.  Lung fields show equal chest expansion and no shortness of breath  abdomen exam is nontender without  distention.  Lower extremity examination does not show any fracture dislocation or blood clot symptoms.  Pelvis is stable neck and back are stable and nontender  Assessment/Plan .Marland KitchenWe are planning surgery for your upper extremity. The risk and benefits of surgery include risk of bleeding infection anesthesia damage to normal structures and failure of the surgery to accomplish its intended goals of relieving symptoms and restoring function with this in mind we'll going to proceed. I have specifically discussed with the patient the pre-and postoperative regime and the does and don'ts and risk and benefits in great detail. Risk and benefits of surgery also include risk of dystrophy chronic nerve pain failure of the healing process to go onto completion and other inherent risks of surgery The relavent the pathophysiology of the disease/injury process, as well as the alternatives for treatment and postoperative course of action has been discussed in great detail with the patient who desires to proceed.  We will do everything in our power to help you (the patient) restore function to the upper extremity. Is a pleasure to see this patient today.   Paulene Floor 09/10/2013, 7:49 AM

## 2013-09-10 NOTE — Anesthesia Preprocedure Evaluation (Signed)
Anesthesia Evaluation  Patient identified by MRN, date of birth, ID band Patient awake    Reviewed: Allergy & Precautions, H&P , NPO status , Patient's Chart, lab work & pertinent test results  Airway Mallampati: II  Neck ROM: full    Dental   Pulmonary sleep apnea and Continuous Positive Airway Pressure Ventilation ,          Cardiovascular negative cardio ROS      Neuro/Psych    GI/Hepatic   Endo/Other  Morbid obesity  Renal/GU      Musculoskeletal   Abdominal   Peds  Hematology   Anesthesia Other Findings   Reproductive/Obstetrics                           Anesthesia Physical Anesthesia Plan  ASA: II  Anesthesia Plan: MAC   Post-op Pain Management:    Induction: Intravenous  Airway Management Planned: Simple Face Mask  Additional Equipment:   Intra-op Plan:   Post-operative Plan:   Informed Consent: I have reviewed the patients History and Physical, chart, labs and discussed the procedure including the risks, benefits and alternatives for the proposed anesthesia with the patient or authorized representative who has indicated his/her understanding and acceptance.     Plan Discussed with: CRNA, Anesthesiologist and Surgeon  Anesthesia Plan Comments:         Anesthesia Quick Evaluation

## 2013-09-10 NOTE — Anesthesia Postprocedure Evaluation (Signed)
Anesthesia Post Note  Patient: Alan Armstrong  Procedure(s) Performed: Procedure(s) (LRB): RIGHT LIMITED OPEN CARPAL TUNNEL RELEASE (Right)  Anesthesia type: MAC  Patient location: PACU  Post pain: Pain level controlled and Adequate analgesia  Post assessment: Post-op Vital signs reviewed, Patient's Cardiovascular Status Stable and Respiratory Function Stable  Last Vitals:  Filed Vitals:   09/10/13 0845  BP: 142/85  Pulse: 52  Temp:   Resp: 18    Post vital signs: Reviewed and stable  Level of consciousness: awake, alert  and oriented  Complications: No apparent anesthesia complications

## 2013-09-11 NOTE — Op Note (Deleted)
NAME:  Alan Armstrong, Alan Armstrong NO.:  [id number]  MEDICAL RECORD NO.:  [id number]  LOCATION:                               FACILITY:  [facility]  PHYSICIAN:  Satira Anis. Keidrick Murty, M.D.DATE OF BIRTH:  1966-06-30  DATE OF PROCEDURE:  09/10/2013 DATE OF DISCHARGE:  09/10/2013                              OPERATIVE REPORT   PREOPERATIVE DIAGNOSIS:  Carpal tunnel syndrome, right upper extremity.  POSTOPERATIVE DIAGNOSIS:  Carpal tunnel syndrome, right upper extremity.  PROCEDURE: 1. Right median nerve/peripheral nerve block at wrist and forearm     level for anesthetic purposes for carpal tunnel release. 2. Right __________ carpal tunnel release.  SURGEON:  Satira Anis. Amedeo Plenty, M.D.  ASSISTANT:  None.  COMPLICATIONS:  None.  ANESTHESIA:  Peripheral nerve block with IV sedation, keeping the patient awake, alert, oriented the entire case.  ESTIMATED BLOOD LOSS:  Minimal.  DRAINS:  None.  INDICATIONS:  This is a 48 year old male who presents with the above- mentioned diagnosis.  I have counseled him in regard to risks and benefits of surgery including risk of infection, bleeding, anesthesia, damage to normal structures, and failure of surgery to accomplish its intended goals of relieving symptoms and restoring function.  With this in mind, he desires to proceed.  All questions have been encouraged and answered preoperatively.  OPERATIVE PROCEDURE:  The patient was seen by myself and Anesthesia, taken to the operative theater, underwent a median nerve/peripheral nerve block.  He was given fentanyl and Versed said for sedation but was kept awake, alert, and oriented the entire case.  Following this, the patient was prepped and draped in usual sterile fashion, Betadine scrub and paint.  Following this, the patient then underwent a very careful and cautious approach to the arm.  Time-out was called.  Pre and postop check list was secured and the tourniquet was  insufflated.  Following this, the patient underwent an incision 1-1.5 cm in nature about the distal edge of the transverse carpal ligament coursing proximally.  Dissection was carried down.  The palmar fascia was incised, followed by release of the distal edge of the transverse carpal ligament.  Once the distal edge was released to our satisfaction, the patient then underwent confirmation of fat pad egression and distal to proximal dissection was carried out __________ 1, 2, and 3 which were placed just under the proximal leading leaflet of the transverse carpal ligament.  Following this, the patient had this __________ placed and obturator disengaged nicely.  This was all done with the patient awake, alert, and oriented, and he had no discomfort.  Following this, __________security clip __________ releasing proximally for the transcarpal ligament.  He tolerated this well.  There were no complications.  The patient had the canal inspected.  Irrigation was applied and hemostasis was obtained with bipolar electrocautery about bleeding points.  Further irrigation was placed followed by wound closure.  The patient had an excellent canal release.  The transverse carpal ligament was very thickened and the nerve was somewhat hyperemic.  There were no space-occupying lesions in the canal; however, this was a limited open approach and deep canal inspection was not carried out.  The patient tolerated the procedure  well.  He had excellent refill, range of motion and no complications.  He was placed in a soft dressing and taken to recovery room.  All sponge, needle, and instrument counts were reported as correct.  There no complicating features.     Satira Anis. Amedeo Plenty, M.D.     [facility]  D:  09/10/2013  T:  09/11/2013  Job:  [id number]

## 2013-09-11 NOTE — Op Note (Signed)
NAME:  Alan Armstrong, Alan Armstrong              ACCOUNT NO.:  [id number]  MEDICAL RECORD NO.:  [id number]  LOCATION:                               FACILITY:  [facility]  PHYSICIAN:  Esli Jernigan M. Dejana Pugsley, M.D.DATE OF BIRTH:  06/26/1966  DATE OF PROCEDURE:  09/10/2013 DATE OF DISCHARGE:  09/10/2013                              OPERATIVE REPORT   PREOPERATIVE DIAGNOSIS:  Carpal tunnel syndrome, right upper extremity.  POSTOPERATIVE DIAGNOSIS:  Carpal tunnel syndrome, right upper extremity.  PROCEDURE: 1. Right median nerve/peripheral nerve block at wrist and forearm     level for anesthetic purposes for carpal tunnel release. 2. Right __________ carpal tunnel release.  SURGEON:  Shawnte Demarest M. Daesia Zylka, M.D.  ASSISTANT:  None.  COMPLICATIONS:  None.  ANESTHESIA:  Peripheral nerve block with IV sedation, keeping the patient awake, alert, oriented the entire case.  ESTIMATED BLOOD LOSS:  Minimal.  DRAINS:  None.  INDICATIONS:  This is a 47-year-old male who presents with the above- mentioned diagnosis.  I have counseled him in regard to risks and benefits of surgery including risk of infection, bleeding, anesthesia, damage to normal structures, and failure of surgery to accomplish its intended goals of relieving symptoms and restoring function.  With this in mind, he desires to proceed.  All questions have been encouraged and answered preoperatively.  OPERATIVE PROCEDURE:  The patient was seen by myself and Anesthesia, taken to the operative theater, underwent a median nerve/peripheral nerve block.  He was given fentanyl and Versed said for sedation but was kept awake, alert, and oriented the entire case.  Following this, the patient was prepped and draped in usual sterile fashion, Betadine scrub and paint.  Following this, the patient then underwent a very careful and cautious approach to the arm.  Time-out was called.  Pre and postop check list was secured and the tourniquet was  insufflated.  Following this, the patient underwent an incision 1-1.5 cm in nature about the distal edge of the transverse carpal ligament coursing proximally.  Dissection was carried down.  The palmar fascia was incised, followed by release of the distal edge of the transverse carpal ligament.  Once the distal edge was released to our satisfaction, the patient then underwent confirmation of fat pad egression and distal to proximal dissection was carried out __________ 1, 2, and 3 which were placed just under the proximal leading leaflet of the transverse carpal ligament.  Following this, the patient had this __________ placed and obturator disengaged nicely.  This was all done with the patient awake, alert, and oriented, and he had no discomfort.  Following this, __________security clip __________ releasing proximally for the transcarpal ligament.  He tolerated this well.  There were no complications.  The patient had the canal inspected.  Irrigation was applied and hemostasis was obtained with bipolar electrocautery about bleeding points.  Further irrigation was placed followed by wound closure.  The patient had an excellent canal release.  The transverse carpal ligament was very thickened and the nerve was somewhat hyperemic.  There were no space-occupying lesions in the canal; however, this was a limited open approach and deep canal inspection was not carried out.  The patient tolerated the procedure   well.  He had excellent refill, range of motion and no complications.  He was placed in a soft dressing and taken to recovery room.  All sponge, needle, and instrument counts were reported as correct.  There no complicating features.     Satira Anis. Amedeo Plenty, M.D.     [facility]  D:  09/10/2013  T:  09/11/2013  Job:  [id number]

## 2013-09-13 ENCOUNTER — Encounter (HOSPITAL_BASED_OUTPATIENT_CLINIC_OR_DEPARTMENT_OTHER): Payer: Self-pay | Admitting: Orthopedic Surgery

## 2013-11-16 ENCOUNTER — Encounter (HOSPITAL_COMMUNITY): Payer: Self-pay | Admitting: Pharmacy Technician

## 2013-11-17 ENCOUNTER — Other Ambulatory Visit (HOSPITAL_COMMUNITY): Payer: Self-pay | Admitting: *Deleted

## 2013-11-17 NOTE — Pre-Procedure Instructions (Signed)
Alan Armstrong  11/17/2013   Your procedure is scheduled on:  Wednesday, November 24, 2013 at 11:30 AM.   Report to [facility] Entrance "A" Admitting Office at 9:30 AM.   Call this number if you have problems the morning of surgery: [phone]   Remember:   Do not eat food or drink liquids after midnight Tuesday, 11/23/13.   Take these medicines the morning of surgery with A SIP OF WATER: oxyCODONE-acetaminophen (PERCOCET/ROXICET) - if needed, tiZANidine (ZANAFLEX) - if needed.  Stop Ibuprofen and Aleve as of today, 11/18/13.  Please bring your back brace with you the morning of surgery.    Do not wear jewelry.  Do not wear lotions, powders, or cologne. You may wear deodorant.  Men may shave face and neck.  Do not bring valuables to the hospital.  [facility] is not responsible                  for any belongings or valuables.               Contacts, dentures or bridgework may not be worn into surgery.  Leave suitcase in the car. After surgery it may be brought to your room.  For patients admitted to the hospital, discharge time is determined by your                treatment team.             Special Instructions: [facility] - Preparing for Surgery  Before surgery, you can play an important role.  Because skin is not sterile, your skin needs to be as free of germs as possible.  You can reduce the number of germs on you skin by washing with CHG (chlorahexidine gluconate) soap before surgery.  CHG is an antiseptic cleaner which kills germs and bonds with the skin to continue killing germs even after washing.  Please DO NOT use if you have an allergy to CHG or antibacterial soaps.  If your skin becomes reddened/irritated stop using the CHG and inform your nurse when you arrive at Short Stay.  Do not shave (including legs and underarms) for at least 48 hours prior to the first CHG shower.  You may shave your face.  Please follow these instructions carefully:   1.  Shower with CHG  Soap the night before surgery and the                                morning of Surgery.  2.  If you choose to wash your hair, wash your hair first as usual with your       normal shampoo.  3.  After you shampoo, rinse your hair and body thoroughly to remove the                      Shampoo.  4.  Use CHG as you would any other liquid soap.  You can apply chg directly       to the skin and wash gently with scrungie or a clean washcloth.  5.  Apply the CHG Soap to your body ONLY FROM THE NECK DOWN.        Do not use on open wounds or open sores.  Avoid contact with your eyes, ears, mouth and genitals (private parts).  Wash genitals (private parts) with your normal soap.  6.  Wash thoroughly, paying special  attention to the area where your surgery        will be performed.  7.  Thoroughly rinse your body with warm water from the neck down.  8.  DO NOT shower/wash with your normal soap after using and rinsing off       the CHG Soap.  9.  Pat yourself dry with a clean towel.            10.  Wear clean pajamas.            11.  Place clean sheets on your bed the night of your first shower and do not        sleep with pets.  Day of Surgery  Do not apply any lotions the morning of surgery.  Please wear clean clothes to the hospital/surgery center.     Please read over the following fact sheets that you were given: Pain Booklet, Coughing and Deep Breathing, MRSA Information and Surgical Site Infection Prevention

## 2013-11-17 NOTE — Progress Notes (Signed)
Called and spoke with Judeen Hammans at Dr. Rolena Infante' office about consent order. I have a question about whether the procedure is put in correctly. She told me that Dr. Rolena Infante is here at [facility] in surgery all day and it would be best to call the OR and have them give him a message. I called the OR main desk and spoke with Roselyn Reef. I asked her to have Dr. Rolena Infante to look at the consent order and see if anything needs to be added and to change it in [facility] if it does.

## 2013-11-18 ENCOUNTER — Inpatient Hospital Stay (HOSPITAL_COMMUNITY)
Admission: RE | Admit: 2013-11-18 | Discharge: 2013-11-18 | Disposition: A | Discharge status: Home / Self Care | Payer: 59 | Source: Ambulatory Visit

## 2013-11-24 ENCOUNTER — Ambulatory Visit: Admit: 2013-11-24 | Payer: Self-pay | Admitting: Orthopedic Surgery

## 2013-11-24 SURGERY — LUMBAR LAMINECTOMY/DECOMPRESSION MICRODISCECTOMY 1 LEVEL
Anesthesia: General

## 2014-04-18 ENCOUNTER — Ambulatory Visit: Payer: 59 | Admitting: Pulmonary Disease

## 2014-04-26 ENCOUNTER — Telehealth: Payer: Self-pay | Admitting: Pulmonary Disease

## 2014-04-26 NOTE — Telephone Encounter (Signed)
Make sure he brings a card to download or machine itself.

## 2014-04-26 NOTE — Telephone Encounter (Signed)
Called spoke with pt. He is scheduled to see [facility] tomorrow 04/27/14. He reports every year [place] writes a letter for him and just wants to remind him he will also needs this tomorrow at his OV. Per pt [place] knows what this needs to state. FYI for [facility]

## 2014-04-27 ENCOUNTER — Encounter (INDEPENDENT_AMBULATORY_CARE_PROVIDER_SITE_OTHER): Payer: Self-pay

## 2014-04-27 ENCOUNTER — Ambulatory Visit (INDEPENDENT_AMBULATORY_CARE_PROVIDER_SITE_OTHER): Payer: 59 | Admitting: Pulmonary Disease

## 2014-04-27 ENCOUNTER — Encounter: Payer: Self-pay | Admitting: Pulmonary Disease

## 2014-04-27 VITALS — BP 138/82 | HR 66 | Ht 68.0 in | Wt 312.0 lb

## 2014-04-27 DIAGNOSIS — G4733 Obstructive sleep apnea (adult) (pediatric): Secondary | ICD-10-CM

## 2014-04-27 NOTE — Telephone Encounter (Signed)
Pt seen in office today.

## 2014-04-27 NOTE — Assessment & Plan Note (Signed)
The patient is doing very well with CPAP on the automatic setting, and his download shows excellent compliance and good control of his AHI. I've encouraged him to work aggressively on weight loss, and to keep up with his mask changes and supplies. 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

## 2014-04-27 NOTE — Progress Notes (Signed)
   Subjective:    Patient ID: Alan Armstrong, male    DOB: Apr 05, 1966, 48 y.o.   MRN: [id number]  HPI The patient comes in today for followup of his obstructive sleep apnea. He is wearing CPAP compliantly by his download, and has excellent control of his AHI. He feels that he is sleeping well, and has excellent daytime alertness. He is having no issues with his mask fit or pressure on the automatic setting. He has gained a few pounds since the last visit appear   Review of Systems  Constitutional: Negative for fever and unexpected weight change.  HENT: Negative for congestion, dental problem, ear pain, nosebleeds, postnasal drip, rhinorrhea, sinus pressure, sneezing, sore throat and trouble swallowing.   Eyes: Negative for redness and itching.  Respiratory: Negative for cough, chest tightness, shortness of breath and wheezing.   Cardiovascular: Negative for palpitations and leg swelling.  Gastrointestinal: Negative for nausea and vomiting.  Genitourinary: Negative for dysuria.  Musculoskeletal: Negative for joint swelling.  Skin: Negative for rash.  Neurological: Negative for headaches.  Hematological: Does not bruise/bleed easily.  Psychiatric/Behavioral: Negative for dysphoric mood. The patient is not nervous/anxious.        Objective:   Physical Exam Obese male in no acute distress Nose without purulence or discharge noted No skin breakdown or pressure necrosis from the CPAP mask Neck without lymphadenopathy or thyromegaly Lower extremities with minimal edema, no cyanosis Alert and oriented, does not appear to be sleepy, moves all 4 extremities.       Assessment & Plan:

## 2014-04-27 NOTE — Patient Instructions (Signed)
Continue with cpap, you are doing well. Keep up with mask changes and supplies. Work on weight loss followup with me again in one year.

## 2015-05-01 ENCOUNTER — Ambulatory Visit: Payer: 59 | Admitting: Pulmonary Disease

## 2015-05-12 ENCOUNTER — Ambulatory Visit (INDEPENDENT_AMBULATORY_CARE_PROVIDER_SITE_OTHER): Payer: 59 | Admitting: Pulmonary Disease

## 2015-05-12 ENCOUNTER — Encounter: Payer: Self-pay | Admitting: Pulmonary Disease

## 2015-05-12 VITALS — BP 142/98 | HR 59 | Ht 68.0 in | Wt 309.0 lb

## 2015-05-12 DIAGNOSIS — Z23 Encounter for immunization: Secondary | ICD-10-CM | POA: Diagnosis not present

## 2015-05-12 DIAGNOSIS — Z9989 Dependence on other enabling machines and devices: Secondary | ICD-10-CM

## 2015-05-12 DIAGNOSIS — G4733 Obstructive sleep apnea (adult) (pediatric): Secondary | ICD-10-CM

## 2015-05-12 NOTE — Assessment & Plan Note (Signed)
Ct auto settings CPAP supplies will be renewed x 1 year Letter provided  Weight loss encouraged, compliance with goal of at least 6 hrs every night is the expectation. Advised against medications with sedative side effects Cautioned against driving when sleepy - understanding that sleepiness will vary on a day to day basis

## 2015-05-12 NOTE — Patient Instructions (Signed)
CPAP is on auto settings Letter given

## 2015-05-12 NOTE — Progress Notes (Signed)
   Subjective:    Patient ID: Alan Armstrong, male    DOB: [birth year], 49 y.o.   MRN: [id number]  HPI 49/M for FU of OSA. Lives in [place]. 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  Chief Complaint  Patient presents with  . Follow-up    Former [facility] patient; doing well on cpap, needs letter for [employer] for [employer].  no concerns.  Flu shot   Compliant, no daytime somnolence or snoring No  Mask or pr issues 29m Download 04/2015 >> avg pr 1 cm, excellent usage, no residuals  NPSG 2006:  AHI 41/hr On auto 5-20cm, nasal mask DME- Psychiatrist  Review of Systems neg for any significant sore throat, dysphagia, itching, sneezing, nasal congestion or excess/ purulent secretions, fever, chills, sweats, unintended wt loss, pleuritic or exertional cp, hempoptysis, orthopnea pnd or change in chronic leg swelling. Also denies presyncope, palpitations, heartburn, abdominal pain, nausea, vomiting, diarrhea or change in bowel or urinary habits, dysuria,hematuria, rash, arthralgias, visual complaints, headache, numbness weakness or ataxia.     Objective:   Physical Exam  Gen. Pleasant, obese, in no distress ENT - no lesions, no post nasal drip Neck: No JVD, no thyromegaly, no carotid bruits Lungs: no use of accessory muscles, no dullness to percussion, decreased without rales or rhonchi  Cardiovascular: Rhythm regular, heart sounds  normal, no murmurs or gallops, no peripheral edema Musculoskeletal: No deformities, no cyanosis or clubbing , no tremors       Assessment & Plan:

## 2015-05-23 ENCOUNTER — Encounter: Payer: Self-pay | Admitting: Pulmonary Disease

## 2015-08-09 ENCOUNTER — Emergency Department (INDEPENDENT_AMBULATORY_CARE_PROVIDER_SITE_OTHER)
Admission: EM | Admit: 2015-08-09 | Discharge: 2015-08-09 | Disposition: A | Discharge status: Home / Self Care | Payer: Worker's Compensation | Source: Home / Self Care | Attending: Emergency Medicine | Admitting: Emergency Medicine

## 2015-08-09 ENCOUNTER — Emergency Department (INDEPENDENT_AMBULATORY_CARE_PROVIDER_SITE_OTHER): Payer: Worker's Compensation

## 2015-08-09 ENCOUNTER — Encounter (HOSPITAL_COMMUNITY): Payer: Self-pay

## 2015-08-09 DIAGNOSIS — S8991XA Unspecified injury of right lower leg, initial encounter: Secondary | ICD-10-CM | POA: Diagnosis not present

## 2015-08-09 DIAGNOSIS — M7150 Other bursitis, not elsewhere classified, unspecified site: Secondary | ICD-10-CM | POA: Diagnosis not present

## 2015-08-09 MED ORDER — DICLOFENAC SODIUM 75 MG PO TBEC: 75.0000 mg | DELAYED_RELEASE_TABLET | Freq: Two times a day (BID) | ORAL | Status: DC

## 2015-08-09 MED ORDER — HYDROCODONE-ACETAMINOPHEN 5-325 MG PO TABS
2.0000 | ORAL_TABLET | ORAL | Status: DC | PRN
Start: 2015-08-09 — End: 2016-06-12

## 2015-08-09 NOTE — ED Provider Notes (Signed)
HPI  SUBJECTIVE:  Alan Armstrong is a 49 y.o. male who presents with right knee pain, patellar swelling after having a trip and fall earlier today leaving work. States that he fell directly on a bent knee, which was followed by dull, achy, throbbing pain, swelling. States that the knee feels "stiff" and prefers to hold it in a straightened position. Symptoms are worse with movement, walking, bending his knee, he tried ice and two aleve.  There are no alleviating factors. No popping, clicking, sensation of instability, giving way. No distal numbness, tingling. No previous history of injury to this knee. Past medical history negative for diabetes, hypertension. Positive for back surgery, osteoporosis of the foot, OSA on CPAP. Patient is Educational psychologist.  Past Medical History  Diagnosis Date  . Other and unspecified hyperlipidemia   . OSA (obstructive sleep apnea)     uses a cpap  . Wears glasses     Past Surgical History  Procedure Laterality Date  . Arm surgery  1987    left-fx radius  . Carpal tunnel release Right 09/10/2013    Procedure: RIGHT LIMITED OPEN CARPAL TUNNEL RELEASE;  Surgeon: Roseanne Kaufman, MD;  Location: [facility];  Service: Orthopedics;  Laterality: Right;    No family history on file.  Social History  Substance Use Topics  . Smoking status: Never Smoker   . Smokeless tobacco: None  . Alcohol Use: Yes    No current facility-administered medications for this encounter.  Current outpatient prescriptions:  .  diclofenac (VOLTAREN) 75 MG EC tablet, Take 1 tablet (75 mg total) by mouth 2 (two) times daily. Take with food, Disp: 30 tablet, Rfl: 0 .  HYDROcodone-acetaminophen (NORCO/VICODIN) 5-325 MG tablet, Take 2 tablets by mouth every 4 (four) hours as needed for moderate pain., Disp: 20 tablet, Rfl: 0  No Known Allergies   ROS  As noted in HPI.   Physical Exam  BP 166/97 mmHg  Pulse 78  Temp(Src) 97.8 F (36.6 C) (Oral)   Resp 17  SpO2 100%  Constitutional: Well developed, well nourished, no acute distress Eyes:  EOMI, conjunctiva normal bilaterally HENT: Normocephalic, atraumatic,mucus membranes moist Respiratory: Normal inspiratory effort Cardiovascular: Normal rate GI: nondistended skin: No rash, skin intact Musculoskeletal: Right knee: Swelling, tenderness over the prepatellar bursa. No erythema. Knee ROM slightly  decreased due to pain, Flexion  intact, Patellar tendon NT, Medial joint NT, Lateral joint NT, Popliteal region NT, Lachman's stable, Varus LCL stress testing stable, Valgus MCL stress testing stable, McMurray's testing normal , distal NVI with intact baseline sensation / motor / pulse distal to knee Neurologic: Alert & oriented x 3, no focal neuro deficits Psychiatric: Speech and behavior appropriate   ED Course   Medications - No data to display  Orders Placed This Encounter  Procedures  . DG Knee Complete 4 Views Right    Standing Status: Standing     Number of Occurrences: 1     Standing Expiration Date:     Order Specific Question:  Reason for Exam (SYMPTOM  OR DIAGNOSIS REQUIRED)    Answer:  fall knee swollen can not bend    No results found for this or any previous visit (from the past 24 hour(s)). Dg Knee Complete 4 Views Right  08/09/2015  CLINICAL DATA:  Fall, right knee pain and swelling EXAM: RIGHT KNEE - COMPLETE 4+ VIEW COMPARISON:  None. FINDINGS: Anterior knee soft tissue swelling on the lateral view. Normal alignment. No acute fracture or  effusion. Minor joint space loss in the medial compartment. IMPRESSION: Anterior patellar region soft tissue swelling. Minor degenerative changes. No acute osseous finding. Electronically Signed   By: Jerilynn Mages.  Shick M.D.   On: 08/09/2015 20:50    ED Clinical Impression  Knee injury, right, initial encounter  Bursitis due to trauma   ED Assessment/Plan   reviewed imaging. No fracture, dislocation. Anterior patellar region soft  tissue swelling. See radiology report for details.  No evidence of fracture. No evidence of neurovascular involvement. Presentation most consistent with a bursitis secondary to trauma. Skin is intact. Home with Ace wrap, diclofenac, Norco. Follow-up with workman's comp in 1-2 days. Writing 2 day work note.   Discussed  imaging, MDM, plan and followup with patient. Patient agrees with plan.    *This clinic note was created using Dragon dictation software. Therefore, there may be occasional mistakes despite careful proofreading.  ?   Melynda Ripple, MD 08/09/15 2131

## 2015-08-09 NOTE — ED Notes (Signed)
Patient states he was leaving work and tripped and fell injuring his right knee Knee is swollen painful and having difficulty bending it

## 2016-04-11 ENCOUNTER — Telehealth: Payer: Self-pay | Admitting: Pulmonary Disease

## 2016-04-11 NOTE — Telephone Encounter (Signed)
Patient on RECALL list to follow up 1 year for OSA.  Patient can see NP does not need to see Dr. Elsworth Soho.  Attempted to contact patient to schedule appointment, left message for patient to call back.

## 2016-04-15 NOTE — Telephone Encounter (Signed)
Attempted to contact patient, left message for patient to return call.

## 2016-05-13 ENCOUNTER — Ambulatory Visit: Payer: Self-pay | Admitting: Acute Care

## 2016-06-12 ENCOUNTER — Ambulatory Visit (INDEPENDENT_AMBULATORY_CARE_PROVIDER_SITE_OTHER): Payer: 59 | Admitting: Acute Care

## 2016-06-12 ENCOUNTER — Encounter: Payer: Self-pay | Admitting: *Deleted

## 2016-06-12 ENCOUNTER — Encounter: Payer: Self-pay | Admitting: Acute Care

## 2016-06-12 DIAGNOSIS — G4733 Obstructive sleep apnea (adult) (pediatric): Secondary | ICD-10-CM

## 2016-06-12 DIAGNOSIS — Z9989 Dependence on other enabling machines and devices: Secondary | ICD-10-CM

## 2016-06-12 NOTE — Assessment & Plan Note (Signed)
Compliant with CPAP for OSA per download No weight loss No day time sleepiness Plan: Continue on CPAP at bedtime. You appear to be benefiting from the treatment Goal is to wear for at least 4-6 hours each night for maximal clinical benefit. Continue to work on weight loss, as the link between excess weight  and sleep apnea is well established.  Do not drive if sleepy. 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 Continue using your So Clean machine daily to clean your device Follow up with Dr. Elsworth Soho In one year or before as needed.  Please contact office for sooner follow up if symptoms do not improve or worsen or seek emergency care.

## 2016-06-12 NOTE — Patient Instructions (Addendum)
It is great to meet you today.  Continue on CPAP at bedtime. You appear to be benefiting from the treatment Goal is to wear for at least 4-6 hours each night for maximal clinical benefit. Continue to work on weight loss, as the link between excess weight  and sleep apnea is well established.  Do not drive if sleepy. 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 Elsworth Soho In one year or before as needed.  Please contact office for sooner follow up if symptoms do not improve or worsen or seek emergency care.

## 2016-06-12 NOTE — Progress Notes (Signed)
History of Present Illness Alan Armstrong is a 50 y.o. male with OSA  On CPAP previous patient of Dr.Clance now seen by Dr. Elsworth Soho.   06/12/2016  OSA  Annual Follow-Up: The patient comes in today for followup of his obstructive sleep apnea. He is wearing CPAP compliantly by his download, and has excellent control of his AHI. He feels that he is sleeping well, and has excellent daytime alertness. He is having no issues with his mask fit or pressure on the automatic setting. He states he knows that his mask does not seal as well when he does not shave.  He has gained a few pounds since the last visit , and we discussed that there is a link between weight loss and improvement in sleep apnea. He states he is planning on working on weight loss this winter.  Tests Down Load:05/14/2016-06/12/2016 APAP Mode 5-15 cm H2O Usage>> 29/30 days ( 97%) >4 hours>> 26 days < 4 hours>> 3 days ( 10%) AHI>> 1.5 No daytime Sleepiness. Worked some nights which is why he has a few missed nights on his download.    Past medical hx Past Medical History:  Diagnosis Date  . OSA (obstructive sleep apnea)    uses a cpap  . Other and unspecified hyperlipidemia   . Wears glasses      Past surgical hx, Family hx, Social hx all reviewed.  No current outpatient prescriptions on file prior to visit.   No current facility-administered medications on file prior to visit.      No Known Allergies  Review Of Systems:  Constitutional:   No  weight loss, night sweats,  Fevers, chills, fatigue, or  lassitude.  HEENT:   No headaches,  Difficulty swallowing,  Tooth/dental problems, or  Sore throat,                No sneezing, itching, ear ache, nasal congestion, post nasal drip,   CV:  No chest pain,  Orthopnea, PND, swelling in lower extremities, anasarca, dizziness, palpitations, syncope.   GI  No heartburn, indigestion, abdominal pain, nausea, vomiting, diarrhea, change in bowel habits, loss of appetite,  bloody stools.   Resp: No shortness of breath with exertion or at rest.  No excess mucus, no productive cough,  No non-productive cough,  No coughing up of blood.  No change in color of mucus.  No wheezing.  No chest wall deformity  Skin: no rash or lesions.  GU: no dysuria, change in color of urine, no urgency or frequency.  No flank pain, no hematuria   MS:  No joint pain or swelling.  No decreased range of motion.  No back pain.  Psych:  No change in mood or affect. No depression or anxiety.  No memory loss.   Vital Signs BP (!) 158/102 (BP Location: Left Arm, Cuff Size: Large)   Pulse 69   Ht 5\' 7"  (1.702 m)   Wt 299 lb (135.6 kg)   SpO2 98%   BMI 46.83 kg/m    Physical Exam:  General- No distress,  A&Ox3, pleasant ENT: No sinus tenderness, TM clear, pale nasal mucosa, no oral exudate,no post nasal drip, no LAN Cardiac: S1, S2, regular rate and rhythm, no murmur Chest: No wheeze/ rales/ dullness; no accessory muscle use, no nasal flaring, no sternal retractions Abd.: Soft Non-tender, obese Ext: No clubbing cyanosis, edema Neuro:  normal strength Skin: No rashes, warm and dry Psych: normal mood and behavior   Assessment/Plan  OSA on  CPAP Compliant with CPAP for OSA per download No weight loss No day time sleepiness Plan: Continue on CPAP at bedtime. You appear to be benefiting from the treatment Goal is to wear for at least 4-6 hours each night for maximal clinical benefit. Continue to work on weight loss, as the link between excess weight  and sleep apnea is well established.  Do not drive if sleepy. 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 Continue using your So Clean machine daily to clean your device Follow up with Dr. Elsworth Soho In one year or before as needed.  Please contact office for sooner follow up if symptoms do not improve or worsen or seek emergency care.     Magdalen Spatz, NP 06/12/2016  4:42  PM

## 2016-06-14 NOTE — Progress Notes (Signed)
Reviewed & agree with plan  

## 2016-10-21 DIAGNOSIS — I10 Essential (primary) hypertension: Secondary | ICD-10-CM | POA: Diagnosis not present

## 2016-12-23 DIAGNOSIS — I10 Essential (primary) hypertension: Secondary | ICD-10-CM | POA: Diagnosis not present

## 2016-12-23 DIAGNOSIS — Z79899 Other long term (current) drug therapy: Secondary | ICD-10-CM | POA: Diagnosis not present

## 2016-12-23 DIAGNOSIS — Z125 Encounter for screening for malignant neoplasm of prostate: Secondary | ICD-10-CM | POA: Diagnosis not present

## 2016-12-30 DIAGNOSIS — G4733 Obstructive sleep apnea (adult) (pediatric): Secondary | ICD-10-CM | POA: Diagnosis not present

## 2016-12-30 DIAGNOSIS — I10 Essential (primary) hypertension: Secondary | ICD-10-CM | POA: Diagnosis not present

## 2016-12-30 DIAGNOSIS — Z0001 Encounter for general adult medical examination with abnormal findings: Secondary | ICD-10-CM | POA: Diagnosis not present

## 2016-12-30 DIAGNOSIS — J309 Allergic rhinitis, unspecified: Secondary | ICD-10-CM | POA: Diagnosis not present

## 2017-02-20 DIAGNOSIS — S30860A Insect bite (nonvenomous) of lower back and pelvis, initial encounter: Secondary | ICD-10-CM | POA: Diagnosis not present

## 2017-02-20 DIAGNOSIS — R5383 Other fatigue: Secondary | ICD-10-CM | POA: Diagnosis not present

## 2017-02-20 DIAGNOSIS — S2096XA Insect bite (nonvenomous) of unspecified parts of thorax, initial encounter: Secondary | ICD-10-CM | POA: Diagnosis not present

## 2017-05-29 ENCOUNTER — Ambulatory Visit (INDEPENDENT_AMBULATORY_CARE_PROVIDER_SITE_OTHER): Payer: 59 | Admitting: Pulmonary Disease

## 2017-05-29 ENCOUNTER — Encounter: Payer: Self-pay | Admitting: Pulmonary Disease

## 2017-05-29 DIAGNOSIS — G4733 Obstructive sleep apnea (adult) (pediatric): Secondary | ICD-10-CM

## 2017-05-29 DIAGNOSIS — Z9989 Dependence on other enabling machines and devices: Secondary | ICD-10-CM | POA: Diagnosis not present

## 2017-05-29 DIAGNOSIS — Z23 Encounter for immunization: Secondary | ICD-10-CM | POA: Diagnosis not present

## 2017-05-29 DIAGNOSIS — I10 Essential (primary) hypertension: Secondary | ICD-10-CM | POA: Diagnosis not present

## 2017-05-29 NOTE — Assessment & Plan Note (Signed)
on auto CPAP settings with average pressure of 12. CPAP supplies will be renewed for a year.  Weight loss encouraged, compliance with goal of at least 4-6 hrs every night is the expectation. Advised against medications with sedative side effects Cautioned against driving when sleepy - understanding that sleepiness will vary on a day to day basis

## 2017-05-29 NOTE — Assessment & Plan Note (Signed)
He will be more compliant with his amlodipine Flu shot today

## 2017-05-29 NOTE — Addendum Note (Signed)
Addended by: Maryanna Shape A on: 05/29/2017 11:11 AM   Modules accepted: Orders

## 2017-05-29 NOTE — Patient Instructions (Signed)
You are on auto CPAP settings with average pressure of 12. CPAP supplies will be renewed for a year. Please take your blood pressure medication

## 2017-05-29 NOTE — Progress Notes (Signed)
   Subjective:    Patient ID: Alan Armstrong, male    DOB: 02-15-1966, 51 y.o.   MRN: [id number]  HPI  51/M for FU of OSA. 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  No problems with mask or pressure. He was diagnosed with hypertension in the interim and placed on amlodipine. His blood pressure was high today and he admits to not taking his dose yesterday at 4 PM. He denies headaches or blurred vision.  Uses a nasal mask, no complaints of mouth dryness. He feels refreshed when waking up inthe pressure in the daytime. Download was reviewed which shows excellent control of events and average pressure of 12 cm, on auto settings 5-20 cm with excellent compliance    NPSG 2006:  AHI 41/hr      Review of Systems  Positive for joint stiffness and pain occasionally shooting down his left thigh  Patient denies significant dyspnea,cough, hemoptysis,  chest pain, palpitations, pedal edema, orthopnea, paroxysmal nocturnal dyspnea, lightheadedness, nausea, vomiting, abdominal or  leg pains      Objective:   Physical Exam  Gen. Pleasant, obese, in no distress ENT - no lesions, no post nasal drip Neck: No JVD, no thyromegaly, no carotid bruits Lungs: no use of accessory muscles, no dullness to percussion, decreased without rales or rhonchi  Cardiovascular: Rhythm regular, heart sounds  normal, no murmurs or gallops, no peripheral edema Musculoskeletal: No deformities, no cyanosis or clubbing , no tremors        Assessment & Plan:

## 2017-07-08 DIAGNOSIS — G4733 Obstructive sleep apnea (adult) (pediatric): Secondary | ICD-10-CM | POA: Diagnosis not present

## 2017-12-22 DIAGNOSIS — M5416 Radiculopathy, lumbar region: Secondary | ICD-10-CM | POA: Diagnosis not present

## 2017-12-22 DIAGNOSIS — Z9889 Other specified postprocedural states: Secondary | ICD-10-CM | POA: Diagnosis not present

## 2017-12-29 DIAGNOSIS — M545 Low back pain: Secondary | ICD-10-CM | POA: Diagnosis not present

## 2017-12-29 DIAGNOSIS — M5410 Radiculopathy, site unspecified: Secondary | ICD-10-CM | POA: Diagnosis not present

## 2017-12-29 DIAGNOSIS — M5416 Radiculopathy, lumbar region: Secondary | ICD-10-CM | POA: Diagnosis not present

## 2017-12-29 DIAGNOSIS — M5117 Intervertebral disc disorders with radiculopathy, lumbosacral region: Secondary | ICD-10-CM | POA: Diagnosis not present

## 2018-01-07 DIAGNOSIS — M5416 Radiculopathy, lumbar region: Secondary | ICD-10-CM | POA: Diagnosis not present

## 2018-01-13 DIAGNOSIS — G4733 Obstructive sleep apnea (adult) (pediatric): Secondary | ICD-10-CM | POA: Diagnosis not present

## 2018-02-04 DIAGNOSIS — M7918 Myalgia, other site: Secondary | ICD-10-CM | POA: Diagnosis not present

## 2018-02-04 DIAGNOSIS — M533 Sacrococcygeal disorders, not elsewhere classified: Secondary | ICD-10-CM | POA: Diagnosis not present

## 2018-02-04 DIAGNOSIS — M5416 Radiculopathy, lumbar region: Secondary | ICD-10-CM | POA: Diagnosis not present

## 2018-02-11 DIAGNOSIS — M533 Sacrococcygeal disorders, not elsewhere classified: Secondary | ICD-10-CM | POA: Diagnosis not present

## 2018-03-18 DIAGNOSIS — M7918 Myalgia, other site: Secondary | ICD-10-CM | POA: Diagnosis not present

## 2018-03-18 DIAGNOSIS — M533 Sacrococcygeal disorders, not elsewhere classified: Secondary | ICD-10-CM | POA: Diagnosis not present

## 2018-03-18 DIAGNOSIS — M5416 Radiculopathy, lumbar region: Secondary | ICD-10-CM | POA: Diagnosis not present

## 2018-05-12 DIAGNOSIS — G4733 Obstructive sleep apnea (adult) (pediatric): Secondary | ICD-10-CM | POA: Diagnosis not present

## 2018-05-25 DIAGNOSIS — I10 Essential (primary) hypertension: Secondary | ICD-10-CM | POA: Diagnosis not present

## 2018-05-25 DIAGNOSIS — Z79899 Other long term (current) drug therapy: Secondary | ICD-10-CM | POA: Diagnosis not present

## 2018-06-01 DIAGNOSIS — Z0001 Encounter for general adult medical examination with abnormal findings: Secondary | ICD-10-CM | POA: Diagnosis not present

## 2018-06-01 DIAGNOSIS — R001 Bradycardia, unspecified: Secondary | ICD-10-CM | POA: Diagnosis not present

## 2018-06-01 DIAGNOSIS — I10 Essential (primary) hypertension: Secondary | ICD-10-CM | POA: Diagnosis not present

## 2018-06-02 ENCOUNTER — Encounter (INDEPENDENT_AMBULATORY_CARE_PROVIDER_SITE_OTHER): Payer: Self-pay | Admitting: *Deleted

## 2018-06-24 ENCOUNTER — Ambulatory Visit: Payer: 59 | Admitting: Adult Health

## 2018-06-24 ENCOUNTER — Encounter: Payer: Self-pay | Admitting: Adult Health

## 2018-06-24 VITALS — BP 136/88 | HR 68 | Ht 68.0 in | Wt 319.2 lb

## 2018-06-24 DIAGNOSIS — G4733 Obstructive sleep apnea (adult) (pediatric): Secondary | ICD-10-CM | POA: Diagnosis not present

## 2018-06-24 DIAGNOSIS — Z9989 Dependence on other enabling machines and devices: Secondary | ICD-10-CM | POA: Diagnosis not present

## 2018-06-24 NOTE — Patient Instructions (Addendum)
Keep up good work  Continue on CPAP At bedtime  Work on healthy weight .  Do not drive if sleepy  Order for new CPAP machine .  Follow up with Dr. Elsworth Soho   In 1 year and As needed

## 2018-06-24 NOTE — Progress Notes (Signed)
@Patient  ID: Alan Armstrong, male    DOB: 06-Nov-1965, 52 y.o.   MRN: [id number]  Chief Complaint  Patient presents with  . Follow-up    OSA    Referring provider: Asencion Noble, MD  HPI: 52 year old male followed for sleep apnea  TEST /EVents  NPSG 2006: AHI 41/hr  06/24/2018 Follow up : OSA  Patient presents for a one-year follow-up.  Patient has underlying severe sleep apnea.  He is on CPAP at bedtime.  He is doing exceptionally well.  He denies any significant daytime sleepiness.  Feels that he benefits from CPAP.  He says he never misses a night.  Download shows excellent compliance with average usage at 8 hours.  Patient is on auto CPAP 5 to 20 cm H2O.  AHI 1.6.  Patient says his machine is getting old.  Says the power cord is frayed and he is requesting a new machine.  Patient says he is very active.. No Known Allergies  Immunization History  Administered Date(s) Administered  . Influenza Split 06/20/2011, 05/24/2018  . Influenza Whole 05/19/2012  . Influenza,inj,Quad PF,6+ Mos 05/12/2015, 06/04/2016, 05/29/2017    Past Medical History:  Diagnosis Date  . OSA (obstructive sleep apnea)    uses a cpap  . Other and unspecified hyperlipidemia   . Wears glasses     Tobacco History: Social History   Tobacco Use  Smoking Status Never Smoker  Smokeless Tobacco Never Used   Counseling given: Not Answered   Outpatient Medications Prior to Visit  Medication Sig Dispense Refill  . amLODipine (NORVASC) 5 MG tablet Take 5 mg by mouth.    . gabapentin (NEURONTIN) 300 MG capsule 3 capsules by mouth at bedtime     No facility-administered medications prior to visit.      Review of Systems  Constitutional:   No  weight loss, night sweats,  Fevers, chills, fatigue, or  lassitude.  HEENT:   No headaches,  Difficulty swallowing,  Tooth/dental problems, or  Sore throat,                No sneezing, itching, ear ache, nasal congestion, post nasal drip,   CV:  No chest  pain,  Orthopnea, PND, swelling in lower extremities, anasarca, dizziness, palpitations, syncope.   GI  No heartburn, indigestion, abdominal pain, nausea, vomiting, diarrhea, change in bowel habits, loss of appetite, bloody stools.   Resp: No shortness of breath with exertion or at rest.  No excess mucus, no productive cough,  No non-productive cough,  No coughing up of blood.  No change in color of mucus.  No wheezing.  No chest wall deformity  Skin: no rash or lesions.  GU: no dysuria, change in color of urine, no urgency or frequency.  No flank pain, no hematuria   MS:  No joint pain or swelling.  No decreased range of motion.  No back pain.    Physical Exam  BP 136/88 (BP Location: Left Arm, Cuff Size: Large)   Pulse 68   Ht 5\' 8"  (1.727 m)   Wt (!) 319 lb 3.2 oz (144.8 kg)   SpO2 99%   BMI 48.53 kg/m   GEN: A/Ox3; pleasant , NAD, obese   HEENT:  /AT,  EACs-clear, TMs-wnl, NOSE-clear, THROAT-clear, no lesions, no postnasal drip or exudate noted.  Class III-IV MP airway  NECK:  Supple w/ fair ROM; no JVD; normal carotid impulses w/o bruits; no thyromegaly or nodules palpated; no lymphadenopathy.    RESP  Clear  P & A; w/o, wheezes/ rales/ or rhonchi. no accessory muscle use, no dullness to percussion  CARD:  RRR, no m/r/g, no peripheral edema, pulses intact, no cyanosis or clubbing.  GI:   Soft & nt; nml bowel sounds; no organomegaly or masses detected.   Musco: Warm bil, no deformities or joint swelling noted.   Neuro: alert, no focal deficits noted.    Skin: Warm, no lesions or rashes    Lab Results:  CBC No results found for: WBC, RBC, HGB, HCT, PLT, MCV, MCH, MCHC, RDW, LYMPHSABS, MONOABS, EOSABS, BASOSABS  BMET No results found for: NA, K, CL, CO2, GLUCOSE, BUN, CREATININE, CALCIUM, GFRNONAA, GFRAA  BNP No results found for: BNP  ProBNP No results found for: PROBNP  Imaging: No results found.    No flowsheet data found.  No results found for:  NITRICOXIDE      Assessment & Plan:   No problem-specific Assessment & Plan notes found for this encounter.     Rexene Edison, NP 06/24/2018

## 2018-06-25 NOTE — Assessment & Plan Note (Signed)
-   Weight loss 

## 2018-06-25 NOTE — Assessment & Plan Note (Signed)
Well controlled on CPAP with excellent control  Needs new machine as power cord is damaged and machine is getting old.   Plan  Patient Instructions  Keep up good work  Continue on CPAP At bedtime  Work on healthy weight .  Do not drive if sleepy  Order for new CPAP machine .  Follow up with Dr. Elsworth Soho   In 1 year and As needed

## 2018-06-26 DIAGNOSIS — G4733 Obstructive sleep apnea (adult) (pediatric): Secondary | ICD-10-CM | POA: Diagnosis not present

## 2018-07-02 ENCOUNTER — Encounter (INDEPENDENT_AMBULATORY_CARE_PROVIDER_SITE_OTHER): Payer: Self-pay | Admitting: *Deleted

## 2018-07-09 ENCOUNTER — Other Ambulatory Visit (INDEPENDENT_AMBULATORY_CARE_PROVIDER_SITE_OTHER): Payer: Self-pay | Admitting: *Deleted

## 2018-07-09 DIAGNOSIS — Z1211 Encounter for screening for malignant neoplasm of colon: Secondary | ICD-10-CM

## 2018-07-21 DIAGNOSIS — G4733 Obstructive sleep apnea (adult) (pediatric): Secondary | ICD-10-CM | POA: Diagnosis not present

## 2018-07-22 DIAGNOSIS — I10 Essential (primary) hypertension: Secondary | ICD-10-CM | POA: Diagnosis not present

## 2018-07-22 DIAGNOSIS — G4733 Obstructive sleep apnea (adult) (pediatric): Secondary | ICD-10-CM | POA: Diagnosis not present

## 2018-07-26 DIAGNOSIS — G4733 Obstructive sleep apnea (adult) (pediatric): Secondary | ICD-10-CM | POA: Diagnosis not present

## 2018-08-25 DIAGNOSIS — M5416 Radiculopathy, lumbar region: Secondary | ICD-10-CM | POA: Diagnosis not present

## 2018-08-26 DIAGNOSIS — G4733 Obstructive sleep apnea (adult) (pediatric): Secondary | ICD-10-CM | POA: Diagnosis not present

## 2018-09-01 DIAGNOSIS — M5416 Radiculopathy, lumbar region: Secondary | ICD-10-CM | POA: Diagnosis not present

## 2018-09-01 DIAGNOSIS — M545 Low back pain: Secondary | ICD-10-CM | POA: Diagnosis not present

## 2018-09-01 DIAGNOSIS — M5417 Radiculopathy, lumbosacral region: Secondary | ICD-10-CM | POA: Diagnosis not present

## 2018-09-21 ENCOUNTER — Encounter (INDEPENDENT_AMBULATORY_CARE_PROVIDER_SITE_OTHER): Payer: Self-pay | Admitting: *Deleted

## 2018-09-21 ENCOUNTER — Telehealth (INDEPENDENT_AMBULATORY_CARE_PROVIDER_SITE_OTHER): Payer: Self-pay | Admitting: *Deleted

## 2018-09-21 NOTE — Telephone Encounter (Signed)
Patient needs suprep 

## 2018-09-24 MED ORDER — SUPREP BOWEL PREP KIT 17.5-3.13-1.6 GM/177ML PO SOLN: 1.0000 | Freq: Once | ORAL | 0 refills | Status: AC

## 2018-09-26 DIAGNOSIS — G4733 Obstructive sleep apnea (adult) (pediatric): Secondary | ICD-10-CM | POA: Diagnosis not present

## 2018-09-29 DIAGNOSIS — M533 Sacrococcygeal disorders, not elsewhere classified: Secondary | ICD-10-CM | POA: Diagnosis not present

## 2018-09-29 DIAGNOSIS — M5416 Radiculopathy, lumbar region: Secondary | ICD-10-CM | POA: Diagnosis not present

## 2018-10-02 ENCOUNTER — Telehealth (INDEPENDENT_AMBULATORY_CARE_PROVIDER_SITE_OTHER): Payer: Self-pay | Admitting: *Deleted

## 2018-10-02 NOTE — Telephone Encounter (Signed)
Referring MD/PCP: fagan   Procedure: tcs  Reason/Indication:  screening  Has patient had this procedure before?  no  If so, when, by whom and where?    Is there a family history of colon cancer?  no  Who?  What age when diagnosed?    Is patient diabetic?   no      Does patient have prosthetic heart valve or mechanical valve?  no  Do you have a pacemaker?  no  Has patient ever had endocarditis? no  Has patient had joint replacement within last 12 months?  no  Is patient constipated or do they take laxatives? no  Does patient have a history of alcohol/drug use?  no  Is patient on blood thinner such as Coumadin, Plavix and/or Aspirin? no  Medications: amlodipine 5 mg daily, gabapentin 300 mg daily  Allergies: nkda  Medication Adjustment per Dr Lindi Adie, NP:   Procedure date & time: 10/28/18 at 930

## 2018-10-05 NOTE — Telephone Encounter (Signed)
agree

## 2018-10-25 DIAGNOSIS — G4733 Obstructive sleep apnea (adult) (pediatric): Secondary | ICD-10-CM | POA: Diagnosis not present

## 2018-10-28 ENCOUNTER — Encounter (HOSPITAL_COMMUNITY)
Admission: RE | Disposition: A | Discharge status: Home / Self Care | Payer: Self-pay | Source: Home / Self Care | Attending: Internal Medicine

## 2018-10-28 ENCOUNTER — Other Ambulatory Visit: Payer: Self-pay

## 2018-10-28 ENCOUNTER — Ambulatory Visit (HOSPITAL_COMMUNITY)
Admission: RE | Admit: 2018-10-28 | Discharge: 2018-10-28 | Disposition: A | Discharge status: Home / Self Care | Payer: 59 | Attending: Internal Medicine | Admitting: Internal Medicine

## 2018-10-28 ENCOUNTER — Encounter (HOSPITAL_COMMUNITY): Payer: Self-pay | Admitting: *Deleted

## 2018-10-28 DIAGNOSIS — D122 Benign neoplasm of ascending colon: Secondary | ICD-10-CM | POA: Insufficient documentation

## 2018-10-28 DIAGNOSIS — Z791 Long term (current) use of non-steroidal anti-inflammatories (NSAID): Secondary | ICD-10-CM | POA: Insufficient documentation

## 2018-10-28 DIAGNOSIS — D127 Benign neoplasm of rectosigmoid junction: Secondary | ICD-10-CM | POA: Diagnosis not present

## 2018-10-28 DIAGNOSIS — K621 Rectal polyp: Secondary | ICD-10-CM

## 2018-10-28 DIAGNOSIS — E785 Hyperlipidemia, unspecified: Secondary | ICD-10-CM | POA: Diagnosis not present

## 2018-10-28 DIAGNOSIS — D125 Benign neoplasm of sigmoid colon: Secondary | ICD-10-CM | POA: Diagnosis not present

## 2018-10-28 DIAGNOSIS — G4733 Obstructive sleep apnea (adult) (pediatric): Secondary | ICD-10-CM | POA: Insufficient documentation

## 2018-10-28 DIAGNOSIS — Z1211 Encounter for screening for malignant neoplasm of colon: Secondary | ICD-10-CM | POA: Insufficient documentation

## 2018-10-28 DIAGNOSIS — I10 Essential (primary) hypertension: Secondary | ICD-10-CM | POA: Insufficient documentation

## 2018-10-28 DIAGNOSIS — K648 Other hemorrhoids: Secondary | ICD-10-CM

## 2018-10-28 DIAGNOSIS — Z79899 Other long term (current) drug therapy: Secondary | ICD-10-CM | POA: Diagnosis not present

## 2018-10-28 DIAGNOSIS — K644 Residual hemorrhoidal skin tags: Secondary | ICD-10-CM | POA: Insufficient documentation

## 2018-10-28 DIAGNOSIS — K5730 Diverticulosis of large intestine without perforation or abscess without bleeding: Secondary | ICD-10-CM | POA: Diagnosis not present

## 2018-10-28 HISTORY — DX: Essential (primary) hypertension: I10

## 2018-10-28 HISTORY — PX: COLONOSCOPY: SHX5424

## 2018-10-28 HISTORY — PX: POLYPECTOMY: SHX5525

## 2018-10-28 SURGERY — COLONOSCOPY
Anesthesia: Moderate Sedation

## 2018-10-28 MED ORDER — SODIUM CHLORIDE 0.9 % IV SOLN
INTRAVENOUS | Status: DC
  Administered 2018-10-28: 09:00:00 via INTRAVENOUS

## 2018-10-28 MED ORDER — MEPERIDINE HCL 50 MG/ML IJ SOLN
INTRAMUSCULAR | Status: DC | PRN
  Administered 2018-10-28 (×3): 25 mg via INTRAVENOUS

## 2018-10-28 MED ORDER — MEPERIDINE HCL 100 MG/ML IJ SOLN
INTRAMUSCULAR | Status: AC
  Filled 2018-10-28: qty 1

## 2018-10-28 MED ORDER — MIDAZOLAM HCL 5 MG/5ML IJ SOLN
INTRAMUSCULAR | Status: DC | PRN
  Administered 2018-10-28 (×2): 1 mg via INTRAVENOUS
  Administered 2018-10-28 (×3): 2 mg via INTRAVENOUS

## 2018-10-28 MED ORDER — MIDAZOLAM HCL 5 MG/5ML IJ SOLN
INTRAMUSCULAR | Status: AC
  Filled 2018-10-28: qty 10

## 2018-10-28 NOTE — H&P (Addendum)
Alan Armstrong is an 53 y.o. male.   Chief Complaint: Patient is here for colonoscopy. HPI: Patient is 53 year old male who is here for screening colonoscopy.  He denies abdominal pain change in bowel habits or rectal bleeding. Family history is negative for CRC.  Past Medical History:  Diagnosis Date  . Hypertension   . OSA (obstructive sleep apnea)    uses a cpap  . Other and unspecified hyperlipidemia   . Wears glasses     Past Surgical History:  Procedure Laterality Date  . arm surgery  1987   left-fx radius  . BACK SURGERY    . CARPAL TUNNEL RELEASE Right 09/10/2013   Procedure: RIGHT LIMITED OPEN CARPAL TUNNEL RELEASE;  Surgeon: Roseanne Kaufman, MD;  Location: [facility];  Service: Orthopedics;  Laterality: Right;    Family History  Problem Relation Age of Onset  . Colon cancer Neg Hx    Social History:  reports that he has never smoked. He has never used smokeless tobacco. He reports current alcohol use of about 14.0 standard drinks of alcohol per week. He reports that he does not use drugs.  Allergies:  Allergies  Allergen Reactions  . Benazepril Cough    Medications Prior to Admission  Medication Sig Dispense Refill  . acetaminophen (TYLENOL) 500 MG tablet Take 500 mg by mouth 2 (two) times daily.    Marland Kitchen amLODipine (NORVASC) 5 MG tablet Take 5 mg by mouth.    . celecoxib (CELEBREX) 100 MG capsule Take 100 mg by mouth 2 (two) times daily.    . chlorthalidone (HYGROTON) 25 MG tablet Take 25 mg by mouth daily.    Marland Kitchen gabapentin (NEURONTIN) 300 MG capsule Take 300-900 mg by mouth See admin instructions. Take 300 mg in the morning and afternoon and 900 mg at night    . lidocaine (LIDODERM) 5 % Place 1 patch onto the skin daily as needed (pain). Remove & Discard patch within 12 hours or as directed by MD      No results found for this or any previous visit (from the past 48 hour(s)). No results found.  ROS  Blood pressure (!) 111/58, pulse (!) 54,  temperature 98.2 F (36.8 C), resp. rate 13, height 5\' 8"  (1.727 m), weight (!) 140.6 kg, SpO2 95 %. Physical Exam  Constitutional:  Obese male in NAD.  HENT:  Mouth/Throat: Oropharynx is clear and moist.  Eyes: Conjunctivae are normal. No scleral icterus.  Neck: No thyromegaly present.  Cardiovascular: Normal rate, regular rhythm and normal heart sounds.  No murmur heard. Respiratory: Effort normal and breath sounds normal.  GI:  Abdomen is full but soft and nontender with organomegaly or masses.  Musculoskeletal:        General: No edema.  Neurological: He is alert.  Skin: Skin is warm and dry.     Assessment/Plan Average risk screening colonoscopy.  Hildred Laser, MD 10/28/2018, 10:03 AM

## 2018-10-28 NOTE — Op Note (Signed)
[facility] Patient Name: Alan Armstrong Procedure Date: 10/28/2018 9:53 AM MRN: [id number] Date of Birth: 01-23-1966 Attending MD: Hildred Laser , MD CSN: [id number] Age: 53 Admit Type: Outpatient Procedure:                Colonoscopy Indications:              Screening for colorectal malignant neoplasm Providers:                Hildred Laser, MD, Janeece Riggers, RN, Charlsie Quest.                            Theda Sers RN, RN, Nelma Rothman, Technician Referring MD:             Asencion Noble, MD Medicines:                Meperidine 75 mg IV, Midazolam 8 mg IV Complications:            No immediate complications. Estimated Blood Loss:     Estimated blood loss was minimal. Procedure:                Pre-Anesthesia Assessment:                           - Prior to the procedure, a History and Physical                            was performed, and patient medications and                            allergies were reviewed. The patient's tolerance of                            previous anesthesia was also reviewed. The risks                            and benefits of the procedure and the sedation                            options and risks were discussed with the patient.                            All questions were answered, and informed consent                            was obtained. Prior Anticoagulants: The patient                            last took previous NSAID medication 1 day prior to                            the procedure. ASA Grade Assessment: II - A patient                            with mild systemic disease. After reviewing the  risks and benefits, the patient was deemed in                            satisfactory condition to undergo the procedure.                           After obtaining informed consent, the colonoscope                            was passed under direct vision. Throughout the                            procedure, the patient's blood  pressure, pulse, and                            oxygen saturations were monitored continuously. The                            [id number]-[id number] ([id number]) scope was introduced through                            the anus and advanced to the the cecum, identified                            by appendiceal orifice and ileocecal valve. The                            colonoscopy was performed without difficulty. The                            patient tolerated the procedure well. The quality                            of the bowel preparation was good. The ileocecal                            valve, appendiceal orifice, and rectum were                            photographed. Scope In: 10:14:42 AM Scope Out: 10:36:51 AM Scope Withdrawal Time: 0 hours 16 minutes 31 seconds  Total Procedure Duration: 0 hours 22 minutes 9 seconds  Findings:      The perianal and digital rectal examinations were normal.      A 7 mm polyp was found in the ascending colon. The polyp was sessile.       The polyp was removed with a cold snare. Resection and retrieval were       complete. To stop active bleeding, one hemostatic clip was successfully       placed (MR conditional). The pathology specimen was placed into Bottle       Number 1.      A 6 mm polyp was found in the mid ascending colon. The polyp was       sessile. The polyp was removed with a cold snare. Resection and  retrieval were complete. To stop active bleeding, one hemostatic clip       was successfully placed (MR conditional). There was no bleeding at the       end of the procedure. The pathology specimen was placed into Bottle       Number 1.      A few small-mouthed diverticula were found in the sigmoid colon.      Two polyps were found in the rectum and distal sigmoid colon. The polyps       were small in size. These polyps were removed with a cold snare.       Resection and retrieval were complete. The pathology specimen was placed       into  Bottle Number 2.      External and internal hemorrhoids were found during retroflexion. The       hemorrhoids were small. Impression:               - One 7 mm polyp in the ascending colon, removed                            with a cold snare. Resected and retrieved. Clip (MR                            conditional) was placed.                           - One 6 mm polyp in the mid ascending colon,                            removed with a cold snare. Resected and retrieved.                            Clip (MR conditional) was placed.                           - Diverticulosis in the sigmoid colon.                           - Two small polyps in the rectum and in the distal                            sigmoid colon, removed with a cold snare. Resected                            and retrieved.                           - External and internal hemorrhoids. Moderate Sedation:      Moderate (conscious) sedation was administered by the endoscopy nurse       and supervised by the endoscopist. The following parameters were       monitored: oxygen saturation, heart rate, blood pressure, CO2       capnography and response to care. Total physician intraservice time was       36 minutes. Recommendation:           - Patient has a contact number available for  emergencies. The signs and symptoms of potential                            delayed complications were discussed with the                            patient. Return to normal activities tomorrow.                            Written discharge instructions were provided to the                            patient.                           - High fiber diet today.                           - Continue present medications.                           - No aspirin, ibuprofen, naproxen, or other                            non-steroidal anti-inflammatory drugs for 1 day.                           - Await pathology results.                            - Repeat colonoscopy is recommended. The                            colonoscopy date will be determined after pathology                            results from today's exam become available for                            review. Procedure Code(s):        --- Professional ---                           [phone], Colonoscopy, flexible; with removal of                            tumor(s), polyp(s), or other lesion(s) by snare                            technique                           99153, Moderate sedation; each additional 15                            minutes intraservice time  G0500, Moderate sedation services provided by the                            same physician or other qualified health care                            professional performing a gastrointestinal                            endoscopic service that sedation supports,                            requiring the presence of an independent trained                            observer to assist in the monitoring of the                            patient's level of consciousness and physiological                            status; initial 15 minutes of intra-service time;                            patient age 60 years or older (additional time may                            be reported with [phone], as appropriate) Diagnosis Code(s):        --- Professional ---                           Z12.11, Encounter for screening for malignant                            neoplasm of colon                           D12.2, Benign neoplasm of ascending colon                           K64.8, Other hemorrhoids                           K62.1, Rectal polyp                           D12.5, Benign neoplasm of sigmoid colon                           K57.30, Diverticulosis of large intestine without                            perforation or abscess without bleeding CPT copyright 2018 American Medical  Association. All rights reserved. The codes documented in this report are preliminary and upon coder review may  be revised to meet current compliance requirements.  Hildred Laser, MD Hildred Laser, MD 10/28/2018 10:44:26 AM This report has been signed electronically. Number of Addenda: 0

## 2018-10-28 NOTE — OR Nursing (Signed)
Alan Armstrong was at [facility] for a procedure on 10/28/2018. He will be able to return to work on 10/29/2018 after 12:00 pm.

## 2018-10-28 NOTE — Discharge Instructions (Signed)
No aspirin or NSAIDs for 24 hours. Resume other medications as before. High-fiber diet No driving for 24 hours. Physician will call with biopsy results     Colonoscopy, Adult, Care After This sheet gives you information about how to care for yourself after your procedure. Your health care provider may also give you more specific instructions. If you have problems or questions, contact your health care provider. What can I expect after the procedure? After the procedure, it is common to have:  A small amount of blood in your stool for 24 hours after the procedure.  Some gas.  Mild abdominal cramping or bloating. Follow these instructions at home: General instructions  For the first 24 hours after the procedure: ? Do not drive or use machinery. ? Do not sign important documents. ? Do not drink alcohol. ? Do your regular daily activities at a slower pace than normal. ? Eat soft, easy-to-digest foods.  Take over-the-counter or prescription medicines only as told by your health care provider. Relieving cramping and bloating   Try walking around when you have cramps or feel bloated.  Apply heat to your abdomen as told by your health care provider. Use a heat source that your health care provider recommends, such as a moist heat pack or a heating pad. ? Place a towel between your skin and the heat source. ? Leave the heat on for 20-30 minutes. ? Remove the heat if your skin turns bright red. This is especially important if you are unable to feel pain, heat, or cold. You may have a greater risk of getting burned. Eating and drinking   Drink enough fluid to keep your urine pale yellow.  Resume your normal diet as instructed by your health care provider. Avoid heavy or fried foods that are hard to digest.  Avoid drinking alcohol for as long as instructed by your health care provider. Contact a health care provider if:  You have blood in your stool 2-3 days after the  procedure. Get help right away if:  You have more than a small spotting of blood in your stool.  You pass large blood clots in your stool.  Your abdomen is swollen.  You have nausea or vomiting.  You have a fever.  You have increasing abdominal pain that is not relieved with medicine. Summary  After the procedure, it is common to have a small amount of blood in your stool. You may also have mild abdominal cramping and bloating.  For the first 24 hours after the procedure, do not drive or use machinery, sign important documents, or drink alcohol.  Contact your health care provider if you have a lot of blood in your stool, nausea or vomiting, a fever, or increased abdominal pain. This information is not intended to replace advice given to you by your health care provider. Make sure you discuss any questions you have with your health care provider. Document Released: 03/19/2004 Document Revised: 05/28/2017 Document Reviewed: 10/17/2015 Elsevier Interactive Patient Education  2019 [employer] Hemorrhoids Hemorrhoids are swollen veins that may develop:  In the butt (rectum). These are called internal hemorrhoids.  Around the opening of the butt (anus). These are called external hemorrhoids. Hemorrhoids can cause pain, itching, or bleeding. Most of the time, they do not cause serious problems. They usually get better with diet changes, lifestyle changes, and other home treatments. What are the causes? This condition may be caused by:  Having trouble pooping (constipation).  Pushing hard (straining) to poop.  Watery poop (diarrhea).  Pregnancy.  Being very overweight (obese).  Sitting for long periods of time.  Heavy lifting or other activity that causes you to strain.  Anal sex.  Riding a bike for a long period of time. What are the signs or symptoms? Symptoms of this condition include:  Pain.  Itching or soreness in the butt.  Bleeding from the butt.  Leaking  poop.  Swelling in the area.  One or more lumps around the opening of your butt. How is this diagnosed? A doctor can often diagnose this condition by looking at the affected area. The doctor may also:  Do an exam that involves feeling the area with a gloved hand (digital rectal exam).  Examine the area inside your butt using a small tube (anoscope).  Order blood tests. This may be done if you have lost a lot of blood.  Have you get a test that involves looking inside the colon using a flexible tube with a camera on the end (sigmoidoscopy or colonoscopy). How is this treated? This condition can usually be treated at home. Your doctor may tell you to change what you eat, make lifestyle changes, or try home treatments. If these do not help, procedures can be done to remove the hemorrhoids or make them smaller. These may involve:  Placing rubber bands at the base of the hemorrhoids to cut off their blood supply.  Injecting medicine into the hemorrhoids to shrink them.  Shining a type of light energy onto the hemorrhoids to cause them to fall off.  Doing surgery to remove the hemorrhoids or cut off their blood supply. Follow these instructions at home: Eating and drinking   Eat foods that have a lot of fiber in them. These include whole grains, beans, nuts, fruits, and vegetables.  Ask your doctor about taking products that have added fiber (fibersupplements).  Reduce the amount of fat in your diet. You can do this by: ? Eating low-fat dairy products. ? Eating less red meat. ? Avoiding processed foods.  Drink enough fluid to keep your pee (urine) pale yellow. Managing pain and swelling   Take a warm-water bath (sitz bath) for 20 minutes to ease pain. Do this 3-4 times a day. You may do this in a bathtub or using a portable sitz bath that fits over the toilet.  If told, put ice on the painful area. It may be helpful to use ice between your warm baths. ? Put ice in a plastic  bag. ? Place a towel between your skin and the bag. ? Leave the ice on for 20 minutes, 2-3 times a day. General instructions  Take over-the-counter and prescription medicines only as told by your doctor. ? Medicated creams and medicines may be used as told.  Exercise often. Ask your doctor how much and what kind of exercise is best for you.  Go to the bathroom when you have the urge to poop. Do not wait.  Avoid pushing too hard when you poop.  Keep your butt dry and clean. Use wet toilet paper or moist towelettes after pooping.  Do not sit on the toilet for a long time.  Keep all follow-up visits as told by your doctor. This is important. Contact a doctor if you:  Have pain and swelling that do not get better with treatment or medicine.  Have trouble pooping.  Cannot poop.  Have pain or swelling outside the area of the hemorrhoids. Get help right away if you have:  Bleeding that will not stop. Summary  Hemorrhoids are swollen veins in the butt or around the opening of the butt.  They can cause pain, itching, or bleeding.  Eat foods that have a lot of fiber in them. These include whole grains, beans, nuts, fruits, and vegetables.  Take a warm-water bath (sitz bath) for 20 minutes to ease pain. Do this 3-4 times a day. This information is not intended to replace advice given to you by your health care provider. Make sure you discuss any questions you have with your health care provider. Document Released: 05/14/2008 Document Revised: 12/25/2017 Document Reviewed: 12/25/2017 Elsevier Interactive Patient Education  2019 [place]. Colon Polyps  Polyps are tissue growths inside the body. Polyps can grow in many places, including the large intestine (colon). A polyp may be a round bump or a mushroom-shaped growth. You could have one polyp or several. Most colon polyps are noncancerous (benign). However, some colon polyps can become cancerous over time. Finding and removing  the polyps early can help prevent this. What are the causes? The exact cause of colon polyps is not known. What increases the risk? You are more likely to develop this condition if you:  Have a family history of colon cancer or colon polyps.  Are older than 31 or older than 45 if you are African American.  Have inflammatory bowel disease, such as ulcerative colitis or Crohn's disease.  Have certain hereditary conditions, such as: ? Familial adenomatous polyposis. ? Lynch syndrome. ? Turcot syndrome. ? Peutz-Jeghers syndrome.  Are overweight.  Smoke cigarettes.  Do not get enough exercise.  Drink too much alcohol.  Eat a diet that is high in fat and red meat and low in fiber.  Had childhood cancer that was treated with abdominal radiation. What are the signs or symptoms? Most polyps do not cause symptoms. If you have symptoms, they may include:  Blood coming from your rectum when having a bowel movement.  Blood in your stool. The stool may look dark red or black.  Abdominal pain.  A change in bowel habits, such as constipation or diarrhea. How is this diagnosed? This condition is diagnosed with a colonoscopy. This is a procedure in which a lighted, flexible scope is inserted into the anus and then passed into the colon to examine the area. Polyps are sometimes found when a colonoscopy is done as part of routine cancer screening tests. How is this treated? Treatment for this condition involves removing any polyps that are found. Most polyps can be removed during a colonoscopy. Those polyps will then be tested for cancer. Additional treatment may be needed depending on the results of testing. Follow these instructions at home: Lifestyle  Maintain a healthy weight, or lose weight if recommended by your health care provider.  Exercise every day or as told by your health care provider.  Do not use any products that contain nicotine or tobacco, such as cigarettes and  e-cigarettes. If you need help quitting, ask your health care provider.  If you drink alcohol, limit how much you have: ? 0-1 drink a day for women. ? 0-2 drinks a day for men.  Be aware of how much alcohol is in your drink. In the [place] one drink equals one 12 oz bottle of beer (355 mL), one 5 oz glass of wine (148 mL), or one 1 oz shot of hard liquor (44 mL). Eating and drinking   Eat foods that are high in fiber, such as fruits, vegetables,  and whole grains.  Eat foods that are high in calcium and vitamin D, such as milk, cheese, yogurt, eggs, liver, fish, and broccoli.  Limit foods that are high in fat, such as fried foods and desserts.  Limit the amount of red meat and processed meat you eat, such as hot dogs, sausage, bacon, and lunch meats. General instructions  Keep all follow-up visits as told by your health care provider. This is important. ? This includes having regularly scheduled colonoscopies. ? Talk to your health care provider about when you need a colonoscopy. Contact a health care provider if:  You have new or worsening bleeding during a bowel movement.  You have new or increased blood in your stool.  You have a change in bowel habits.  You lose weight for no known reason. Summary  Polyps are tissue growths inside the body. Polyps can grow in many places, including the colon.  Most colon polyps are noncancerous (benign), but some can become cancerous over time.  This condition is diagnosed with a colonoscopy.  Treatment for this condition involves removing any polyps that are found. Most polyps can be removed during a colonoscopy. This information is not intended to replace advice given to you by your health care provider. Make sure you discuss any questions you have with your health care provider. Document Released: 05/01/2004 Document Revised: 11/20/2017 Document Reviewed: 11/20/2017 Elsevier Interactive Patient Education  2019 [employer].

## 2018-11-02 ENCOUNTER — Encounter (HOSPITAL_COMMUNITY): Payer: Self-pay | Admitting: Internal Medicine

## 2018-11-25 DIAGNOSIS — G4733 Obstructive sleep apnea (adult) (pediatric): Secondary | ICD-10-CM | POA: Diagnosis not present

## 2018-12-04 DIAGNOSIS — M533 Sacrococcygeal disorders, not elsewhere classified: Secondary | ICD-10-CM | POA: Diagnosis not present

## 2018-12-04 DIAGNOSIS — M7918 Myalgia, other site: Secondary | ICD-10-CM | POA: Diagnosis not present

## 2018-12-04 DIAGNOSIS — M5416 Radiculopathy, lumbar region: Secondary | ICD-10-CM | POA: Diagnosis not present

## 2019-06-11 ENCOUNTER — Ambulatory Visit: Payer: 59 | Admitting: Pulmonary Disease

## 2019-06-11 ENCOUNTER — Encounter: Payer: Self-pay | Admitting: Pulmonary Disease

## 2019-06-11 ENCOUNTER — Other Ambulatory Visit: Payer: Self-pay

## 2019-06-11 DIAGNOSIS — G4733 Obstructive sleep apnea (adult) (pediatric): Secondary | ICD-10-CM | POA: Diagnosis not present

## 2019-06-11 DIAGNOSIS — Z9989 Dependence on other enabling machines and devices: Secondary | ICD-10-CM | POA: Diagnosis not present

## 2019-06-11 NOTE — Assessment & Plan Note (Signed)
Weight loss encouraged 

## 2019-06-11 NOTE — Progress Notes (Signed)
   Subjective:    Patient ID: Alan Armstrong, male    DOB: 23-Sep-1965, 53 y.o.   MRN: [id number]  HPI  53 yo man for FU of OSA. 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  His day job is at the [employer]  Underwent hernia surgery a week ago, still having abdominal pain, has cut down on pain medications. No problems with mask or pressure, sleeping well, no daytime somnolence CPAP download was reviewed which shows excellent control of events on CPAP 12 cm, great compliance and minimal leak   Significant tests/ events reviewed  NPSG 2006: AHI 41/hr  Review of Systems Patient denies significant dyspnea,cough, hemoptysis,  chest pain, palpitations, pedal edema, orthopnea, paroxysmal nocturnal dyspnea, lightheadedness, nausea, vomiting, abdominal or  leg pains      Objective:   Physical Exam  Gen. Pleasant, obese, in no distress ENT - no lesions, no post nasal drip Neck: No JVD, no thyromegaly, no carotid bruits Lungs: no use of accessory muscles, no dullness to percussion, decreased without rales or rhonchi  Cardiovascular: Rhythm regular, heart sounds  normal, no murmurs or gallops, no peripheral edema Musculoskeletal: No deformities, no cyanosis or clubbing , no tremors ABd- lap incision bruising x 4      Assessment & Plan:

## 2019-06-11 NOTE — Assessment & Plan Note (Signed)
CPAP is working well, set at 12 cm CPAP supplies will be renewed as needed Letter for boat license provided  Discussed effect of narcotics and OSA  Weight loss encouraged, compliance with goal of at least 4-6 hrs every night is the expectation. Advised against medications with sedative side effects Cautioned against driving when sleepy - understanding that sleepiness will vary on a day to day basis

## 2019-06-11 NOTE — Patient Instructions (Signed)
CPAP is working well, set at 12 cm CPAP supplies will be renewed as needed Letter for boat license provided

## 2020-01-25 ENCOUNTER — Ambulatory Visit
Admission: RE | Admit: 2020-01-25 | Discharge: 2020-01-25 | Disposition: A | Discharge status: Home / Self Care | Payer: Worker's Compensation | Source: Ambulatory Visit | Attending: Nurse Practitioner | Admitting: Nurse Practitioner

## 2020-01-25 ENCOUNTER — Other Ambulatory Visit: Payer: Self-pay | Admitting: Nurse Practitioner

## 2020-01-25 ENCOUNTER — Other Ambulatory Visit: Payer: Self-pay

## 2020-01-25 DIAGNOSIS — M79609 Pain in unspecified limb: Secondary | ICD-10-CM

## 2020-01-25 DIAGNOSIS — M25422 Effusion, left elbow: Secondary | ICD-10-CM

## 2020-11-25 IMAGING — CR DG ELBOW COMPLETE 3+V*L*
4 series · 4 of 4 positions shown · non-contrast
Comparison: None.

CLINICAL DATA: Recent fall with left elbow pain, initial encounter

EXAM:
LEFT ELBOW - COMPLETE 3+ VIEW

[x elbow lat left]
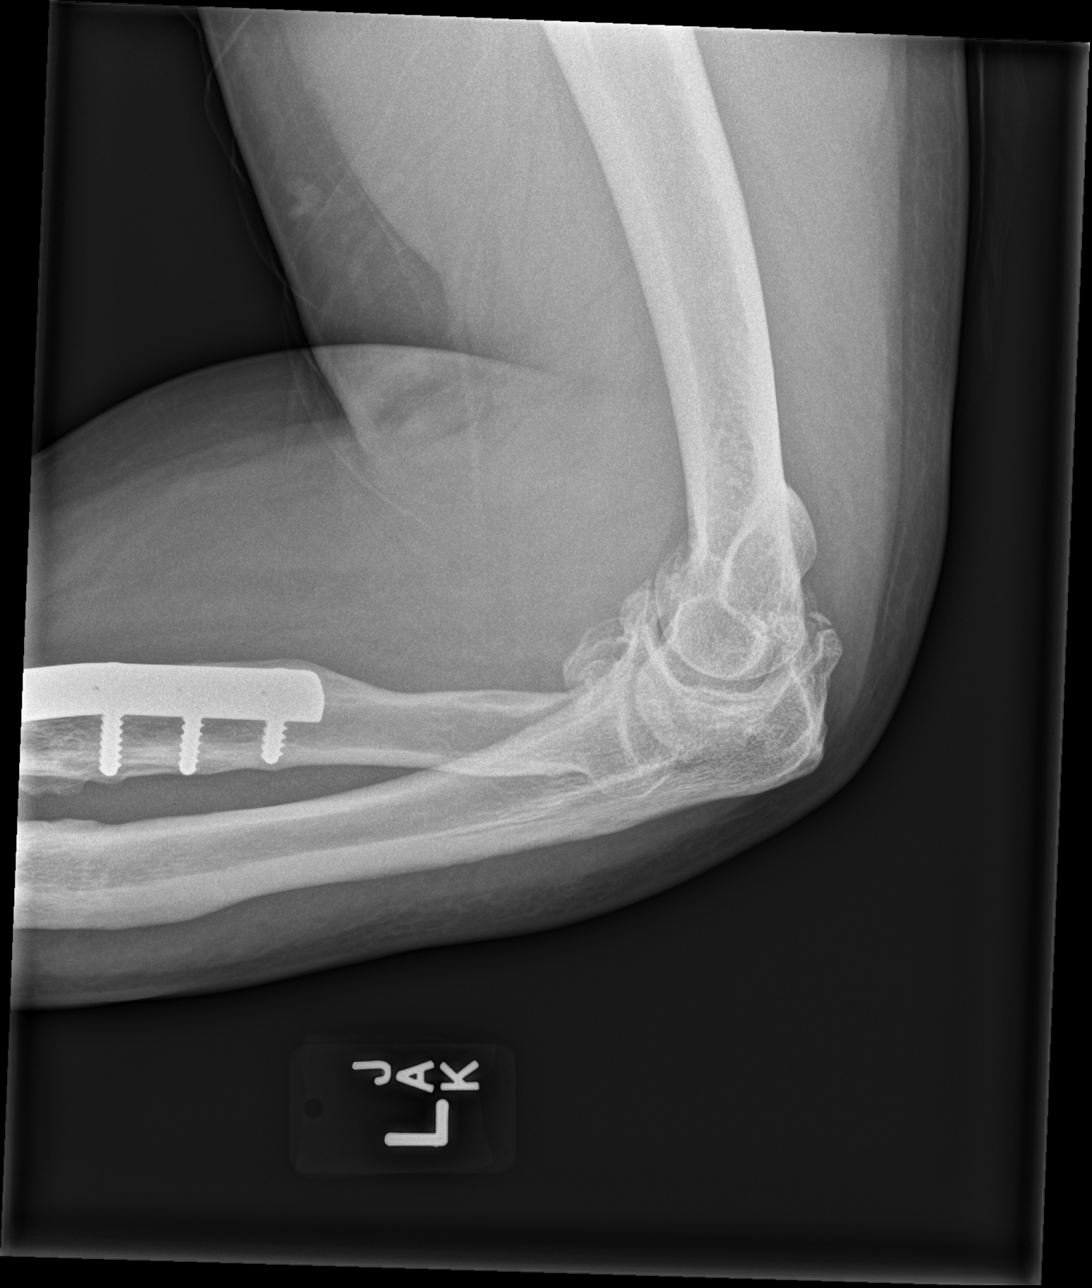

[x elbow ap left]
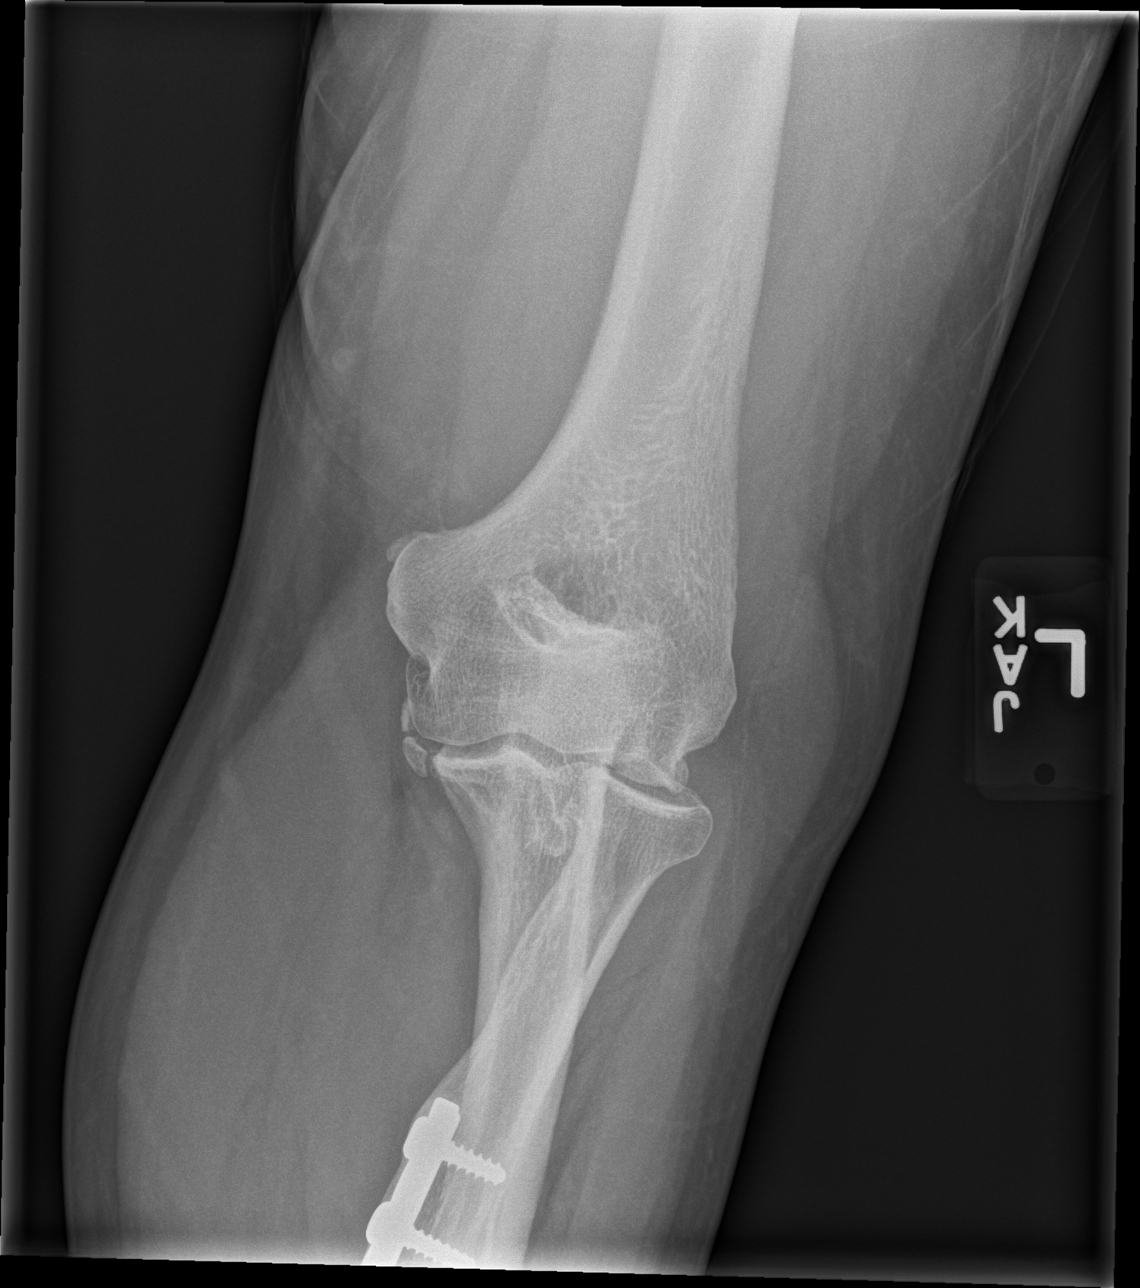

[x elbow obl left (1 of 2)]
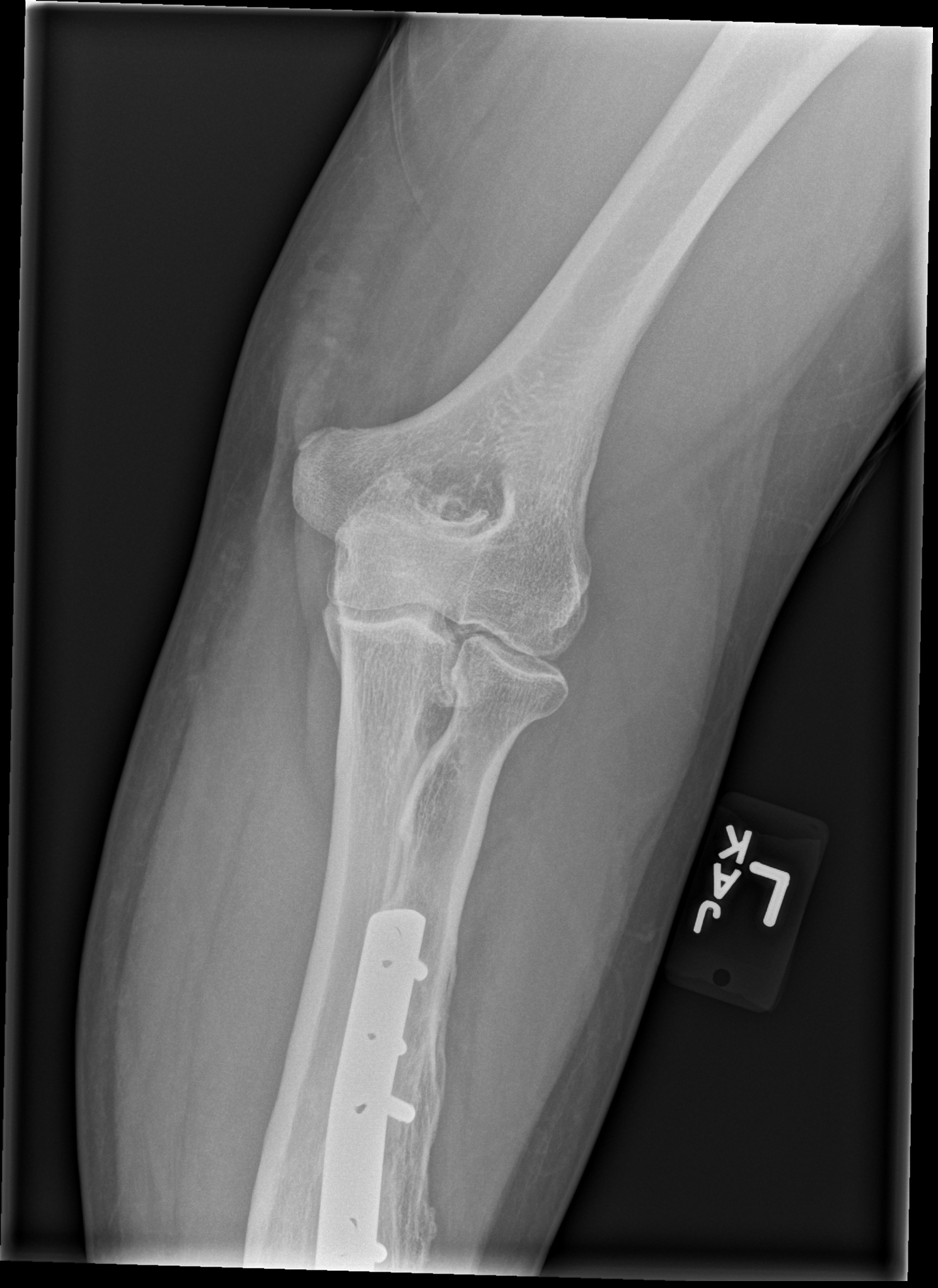

[x elbow obl left (2 of 2)]
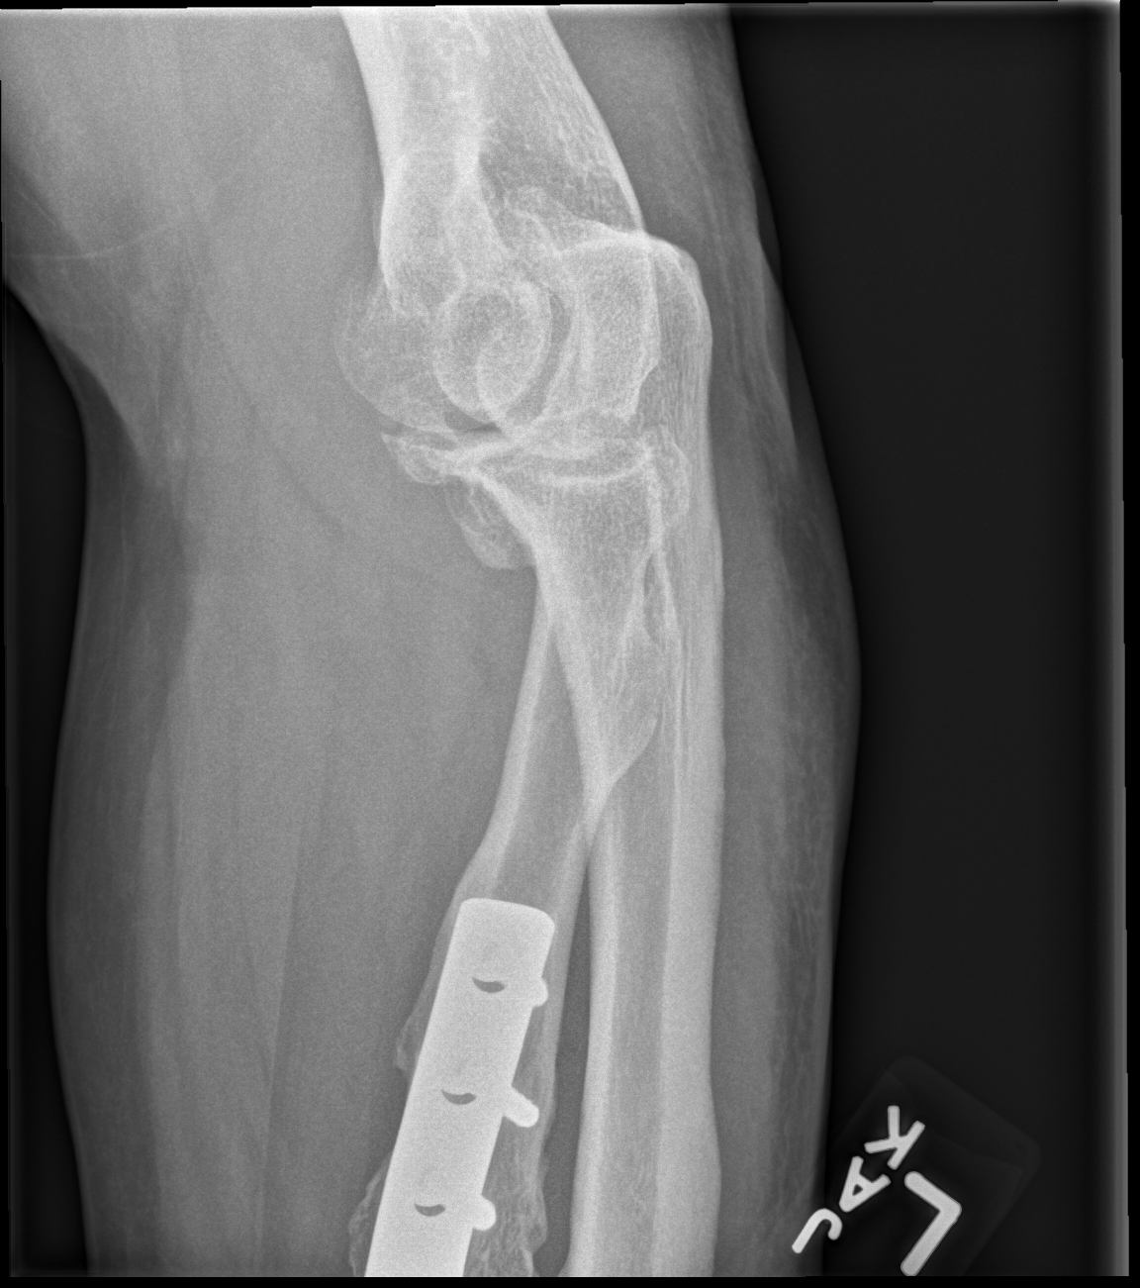

[4 of 4 positions shown; findings below may reference images not displayed]

FINDINGS: Degenerative changes of the elbow joint are seen. No acute fracture
or dislocation is noted. No joint effusion is noted. Changes of
prior fracture with fixation in the midshaft of the radius.
IMPRESSION: Chronic changes about the elbow.

Postoperative changes in the midshaft of the radius.

## 2023-10-03 ENCOUNTER — Encounter (INDEPENDENT_AMBULATORY_CARE_PROVIDER_SITE_OTHER): Payer: Self-pay | Admitting: *Deleted

## 2023-10-16 ENCOUNTER — Telehealth (INDEPENDENT_AMBULATORY_CARE_PROVIDER_SITE_OTHER): Payer: Self-pay | Admitting: Gastroenterology

## 2023-10-16 NOTE — Telephone Encounter (Signed)
 Ok to schedule.  Room 1/2  Thanks,  Vista Lawman, MD Gastroenterology and Hepatology [facility] Gastroenter[facility]

## 2023-10-16 NOTE — Telephone Encounter (Signed)
 Who is your primary care physician: Carylon Perches   Reasons for the colonoscopy:   Have you had a colonoscopy before?  Yes 10/28/2018  Do you have family history of colon cancer? no  Previous colonoscopy with polyps removed? Yes 10/28/2018  Do you have a history colorectal cancer?   no  Are you diabetic? If yes, Type 1 or Type 2?    no  Do you have a prosthetic or mechanical heart valve? no  Do you have a pacemaker/defibrillator?   no  Have you had endocarditis/atrial fibrillation? no  Have you had joint replacement within the last 12 months?  no  Do you tend to be constipated or have to use laxatives? no  Do you have any history of drugs or alchohol?  no  Do you use supplemental oxygen?  no  Have you had a stroke or heart attack within the last 6 months? no  Do you take weight loss medication?  yes      Do you take any blood-thinning medications such as: (aspirin, warfarin, Plavix, Aggrenox)  no  If yes we need the name, milligram, dosage and who is prescribing doctor  Current Outpatient Medications on File Prior to Visit  Medication Sig Dispense Refill   acetaminophen (TYLENOL) 500 MG tablet Take 500 mg by mouth 2 (two) times daily.     allopurinol (ZYLOPRIM) 300 MG tablet      amLODipine (NORVASC) 5 MG tablet Take 5 mg by mouth.     ascorbic acid (VITAMIN C) 1000 MG tablet      celecoxib (CELEBREX) 100 MG capsule Take 100 mg by mouth 2 (two) times daily.     chlorthalidone (HYGROTON) 25 MG tablet Take 25 mg by mouth daily.     colchicine 0.6 MG tablet      gabapentin (NEURONTIN) 300 MG capsule Take 300-900 mg by mouth See admin instructions. Take 300 mg in the morning and afternoon and 900 mg at night     levocetirizine (XYZAL) 5 MG tablet Take by mouth.     lidocaine (LIDODERM) 5 % Place 1 patch onto the skin daily as needed (pain). Remove & Discard patch within 12 hours or as directed by MD     WEGOVY 2.4 MG/0.75ML SOAJ      oxybutynin (DITROPAN) 5 MG tablet Take 5 mg by  mouth 3 (three) times daily. (Patient not taking: Reported on 10/16/2023)     sulfamethoxazole-trimethoprim (BACTRIM DS) 800-160 MG tablet Take 1 tablet by mouth 2 (two) times daily. (Patient not taking: Reported on 10/16/2023)     No current facility-administered medications on file prior to visit.    Allergies  Allergen Reactions   Benazepril Cough     Pharmacy: [facility]  Primary Insurance Name: [employer]  Best number where you can be reached: [phone]

## 2023-10-21 NOTE — Telephone Encounter (Signed)
 Pt contacted and states that he will need to talk with wife to coordinate when she can bring him. Either he or his spouse Alan Armstrong) will give me call back.

## 2023-11-03 MED ORDER — PEG 3350-KCL-NA BICARB-NACL 420 G PO SOLR: 4000.0000 mL | Freq: Once | ORAL | 0 refills | Status: AC

## 2023-11-03 NOTE — Addendum Note (Signed)
 Addended by: Marlowe Shores on: 11/03/2023 02:50 PM   Modules accepted: Orders

## 2023-11-03 NOTE — Telephone Encounter (Signed)
 Pt wife returned call. Pt scheduled. Instructions will be mailed to pt. No pa needed per [facility]. Prep sent to pharmacy

## 2023-11-03 NOTE — Telephone Encounter (Signed)
 Pt wife Delorise Shiner left voicemail in regards to scheduling pt. Returned call but had to leave voicemail

## 2023-11-12 NOTE — Telephone Encounter (Signed)
 Patient spouse Delorise Shiner called in and stated patient was supposed to be scheduled with Dr. Levon Hedger and not Dr. Tasia Catchings. Advised will forward to tanya for her return tomorrow.

## 2023-11-13 NOTE — Telephone Encounter (Signed)
 Pt wife contacted. Wife states that a co worker over the hospital highly recommended Dr.Castaneda. Advised pt that we put new pts with Dr.Ahmed since Dr.Castaneda has more patients at this time. Pt wife states she will talk to you when she is back over at the hospital. (Pt wife works as Scientist, clinical (histocompatibility and immunogenetics) at [employer]). OK to place pt with you? Please advise. Thank you

## 2023-11-13 NOTE — Telephone Encounter (Signed)
 Ok to place with me, please double check with Dr. Tasia Catchings if there are any issues with this, but if fine ok to schedule with me

## 2023-11-13 NOTE — Telephone Encounter (Signed)
 Pt wife contacted. Pt wife states she will need to call back since she is driving.

## 2023-11-13 NOTE — Telephone Encounter (Signed)
 Yes definitely , thank for updating

## 2023-11-13 NOTE — Telephone Encounter (Signed)
 Dr.Ahmed- OK to place with Dr.Castaneda? Please advise. Thank you!

## 2023-11-13 NOTE — Telephone Encounter (Signed)
 Pt wife returned call and pt is now scheduled with Dr.Castaneda on 12/16/23 at 8:15am. Will send new instructions once telephone pre op is received.

## 2023-12-12 ENCOUNTER — Encounter (HOSPITAL_COMMUNITY)
Admission: RE | Admit: 2023-12-12 | Discharge: 2023-12-12 | Disposition: A | Discharge status: Home / Self Care | Source: Ambulatory Visit | Attending: Gastroenterology | Admitting: Gastroenterology

## 2023-12-12 ENCOUNTER — Other Ambulatory Visit: Payer: Self-pay

## 2023-12-12 ENCOUNTER — Encounter (HOSPITAL_COMMUNITY): Payer: Self-pay

## 2023-12-15 NOTE — Anesthesia Preprocedure Evaluation (Addendum)
 Anesthesia Evaluation  Patient identified by MRN, date of birth, ID band Patient awake    Airway Mallampati: III  TM Distance: >3 FB Neck ROM: Full    Dental  (+) Teeth Intact, Dental Advisory Given   Pulmonary sleep apnea and Continuous Positive Airway Pressure Ventilation    Pulmonary exam normal breath sounds clear to auscultation       Cardiovascular hypertension, Normal cardiovascular exam Rhythm:Regular Rate:Normal     Neuro/Psych negative neurological ROS  negative psych ROS   GI/Hepatic Neg liver ROS,,,  Endo/Other    Class 3 obesity  Renal/GU negative Renal ROS  negative genitourinary   Musculoskeletal negative musculoskeletal ROS (+)    Abdominal   Peds  Hematology negative hematology ROS (+)   Anesthesia Other Findings   Reproductive/Obstetrics                             Anesthesia Physical Anesthesia Plan  ASA: 3  Anesthesia Plan: General   Post-op Pain Management: Minimal or no pain anticipated   Induction: Intravenous  PONV Risk Score and Plan: Propofol  infusion  Airway Management Planned: Nasal Cannula and Natural Airway  Additional Equipment: None  Intra-op Plan:   Post-operative Plan:   Informed Consent: I have reviewed the patients History and Physical, chart, labs and discussed the procedure including the risks, benefits and alternatives for the proposed anesthesia with the patient or authorized representative who has indicated his/her understanding and acceptance.     Dental advisory given  Plan Discussed with: CRNA  Anesthesia Plan Comments:        Anesthesia Quick Evaluation

## 2023-12-16 ENCOUNTER — Ambulatory Visit (HOSPITAL_COMMUNITY)
Admission: RE | Admit: 2023-12-16 | Discharge: 2023-12-16 | Disposition: A | Discharge status: Home / Self Care | Attending: Gastroenterology | Admitting: Gastroenterology

## 2023-12-16 ENCOUNTER — Ambulatory Visit (HOSPITAL_BASED_OUTPATIENT_CLINIC_OR_DEPARTMENT_OTHER): Payer: Self-pay | Admitting: Certified Registered"

## 2023-12-16 ENCOUNTER — Ambulatory Visit (HOSPITAL_COMMUNITY): Payer: Self-pay | Admitting: Certified Registered"

## 2023-12-16 ENCOUNTER — Encounter (HOSPITAL_COMMUNITY)
Admission: RE | Disposition: A | Discharge status: Home / Self Care | Payer: Self-pay | Source: Home / Self Care | Attending: Gastroenterology

## 2023-12-16 DIAGNOSIS — D123 Benign neoplasm of transverse colon: Secondary | ICD-10-CM | POA: Insufficient documentation

## 2023-12-16 DIAGNOSIS — K5140 Inflammatory polyps of colon without complications: Secondary | ICD-10-CM | POA: Diagnosis not present

## 2023-12-16 DIAGNOSIS — Z860100 Personal history of colon polyps, unspecified: Secondary | ICD-10-CM

## 2023-12-16 DIAGNOSIS — K5730 Diverticulosis of large intestine without perforation or abscess without bleeding: Secondary | ICD-10-CM | POA: Insufficient documentation

## 2023-12-16 DIAGNOSIS — K635 Polyp of colon: Secondary | ICD-10-CM | POA: Insufficient documentation

## 2023-12-16 DIAGNOSIS — I10 Essential (primary) hypertension: Secondary | ICD-10-CM | POA: Insufficient documentation

## 2023-12-16 DIAGNOSIS — G4733 Obstructive sleep apnea (adult) (pediatric): Secondary | ICD-10-CM | POA: Diagnosis not present

## 2023-12-16 DIAGNOSIS — Z1211 Encounter for screening for malignant neoplasm of colon: Secondary | ICD-10-CM | POA: Insufficient documentation

## 2023-12-16 DIAGNOSIS — K621 Rectal polyp: Secondary | ICD-10-CM | POA: Diagnosis not present

## 2023-12-16 DIAGNOSIS — D120 Benign neoplasm of cecum: Secondary | ICD-10-CM

## 2023-12-16 DIAGNOSIS — Z6841 Body Mass Index (BMI) 40.0 and over, adult: Secondary | ICD-10-CM | POA: Insufficient documentation

## 2023-12-16 DIAGNOSIS — K648 Other hemorrhoids: Secondary | ICD-10-CM | POA: Insufficient documentation

## 2023-12-16 DIAGNOSIS — E66813 Obesity, class 3: Secondary | ICD-10-CM | POA: Diagnosis not present

## 2023-12-16 DIAGNOSIS — Z860101 Personal history of adenomatous and serrated colon polyps: Secondary | ICD-10-CM

## 2023-12-16 HISTORY — PX: COLONOSCOPY: SHX5424

## 2023-12-16 LAB — HM COLONOSCOPY

## 2023-12-16 SURGERY — COLONOSCOPY
Anesthesia: General

## 2023-12-16 MED ORDER — SPOT INK MARKER SYRINGE KIT
PACK | SUBMUCOSAL | Status: DC | PRN
Start: 2023-12-16 — End: 2023-12-16
  Administered 2023-12-16: 1 mL via SUBMUCOSAL

## 2023-12-16 MED ORDER — LACTATED RINGERS IV SOLN: INTRAVENOUS | Status: DC | PRN

## 2023-12-16 MED ORDER — PROPOFOL 10 MG/ML IV BOLUS
INTRAVENOUS | Status: DC | PRN
  Administered 2023-12-16: 250 mg via INTRAVENOUS
  Administered 2023-12-16: 125 ug/kg/min via INTRAVENOUS
  Administered 2023-12-16: 100 ug/kg/min via INTRAVENOUS
  Administered 2023-12-16: 80 ug/kg/min via INTRAVENOUS

## 2023-12-16 NOTE — Anesthesia Postprocedure Evaluation (Signed)
 Anesthesia Post Note  Patient: Alan Armstrong  Procedure(s) Performed: COLONOSCOPY  Patient location during evaluation: PACU Anesthesia Type: General Level of consciousness: awake and alert Pain management: pain level controlled Vital Signs Assessment: post-procedure vital signs reviewed and stable Respiratory status: spontaneous breathing, nonlabored ventilation, respiratory function stable and patient connected to nasal cannula oxygen Cardiovascular status: blood pressure returned to baseline and stable Postop Assessment: no apparent nausea or vomiting Anesthetic complications: no   There were no known notable events for this encounter.   Last Vitals:  Vitals:   12/16/23 0730 12/16/23 0917  BP: 119/85 (!) 98/49  Pulse: 60 64  Resp: 15 (!) 21  Temp: 36.7 C 36.4 C  SpO2: 97% 97%    Last Pain:  Vitals:   12/16/23 0917  TempSrc: Oral  PainSc: 0-No pain                 Shamarie Call L Deklyn Gibbon

## 2023-12-16 NOTE — Discharge Instructions (Signed)
 You are being discharged to home.  Resume your previous diet.  We are waiting for your pathology results.  Your physician has recommended a repeat colonoscopy for surveillance based on pathology results.

## 2023-12-16 NOTE — Transfer of Care (Signed)
 Immediate Anesthesia Transfer of Care Note  Patient: Alan Armstrong  Procedure(s) Performed: COLONOSCOPY  Patient Location: Short Stay  Anesthesia Type:General  Level of Consciousness: awake and patient cooperative  Airway & Oxygen Therapy: Patient Spontanous Breathing  Post-op Assessment: Report given to RN and Post -op Vital signs reviewed and stable  Post vital signs: Reviewed and stable  Last Vitals:  Vitals Value Taken Time  BP 98/49 12/16/23  0920  Temp 97.6 12/16/23  0920  Pulse 63 12/16/23  0920  Resp 12 12/16/23  0920  SpO2 97 12/16/23  0920    Last Pain:  Vitals:   12/16/23 0820  PainSc: 10-Worst pain ever         Complications: No notable events documented.

## 2023-12-16 NOTE — H&P (Addendum)
 Alan Armstrong is an 58 y.o. male.   Chief Complaint: history of colonic polyps HPI: 58 year old male with past medical history of hypertension, OSA, hyperlipidemia, coming for history of colonic polyps.  Last colonoscopy performed in 2020 which showed 2 sessile serrated polyps.  The patient denies having any complaints such as melena, hematochezia, abdominal pain or distention, change in her bowel movement consistency or frequency, no changes in weight recently.  No family history of colorectal cancer.   Past Medical History:  Diagnosis Date   Hypertension    OSA (obstructive sleep apnea)    uses a cpap   Other and unspecified hyperlipidemia    Wears glasses     Past Surgical History:  Procedure Laterality Date   arm surgery  1987   left-fx radius   BACK SURGERY     CARPAL TUNNEL RELEASE Right 09/10/2013   Procedure: RIGHT LIMITED OPEN CARPAL TUNNEL RELEASE;  Surgeon: Ronn Cohn, MD;  Location: [facility];  Service: Orthopedics;  Laterality: Right;   COLONOSCOPY N/A 10/28/2018   Procedure: COLONOSCOPY;  Surgeon: Ruby Corporal, MD;  Location: [facility];  Service: Endoscopy;  Laterality: N/A;  930   POLYPECTOMY  10/28/2018   Procedure: POLYPECTOMY;  Surgeon: Ruby Corporal, MD;  Location: [facility];  Service: Endoscopy;;   UMBILICAL HERNIA REPAIR      Family History  Problem Relation Age of Onset   Colon cancer Neg Hx    Social History:  reports that he has never smoked. He has never used smokeless tobacco. He reports current alcohol use of about 14.0 standard drinks of alcohol per week. He reports that he does not use drugs.  Allergies:  Allergies  Allergen Reactions   Benazepril Cough    Medications Prior to Admission  Medication Sig Dispense Refill   allopurinol (ZYLOPRIM) 300 MG tablet      amLODipine (NORVASC) 5 MG tablet Take 5 mg by mouth.     ascorbic acid (VITAMIN C) 1000 MG tablet      celecoxib (CELEBREX) 100 MG capsule Take 100  mg by mouth 2 (two) times daily.     chlorthalidone (HYGROTON) 25 MG tablet Take 25 mg by mouth daily.     colchicine 0.6 MG tablet      gabapentin (NEURONTIN) 300 MG capsule Take 300-900 mg by mouth See admin instructions. Take 300 mg in the morning and afternoon and 900 mg at night     levocetirizine (XYZAL) 5 MG tablet Take by mouth.     acetaminophen  (TYLENOL ) 500 MG tablet Take 500 mg by mouth 2 (two) times daily.     lidocaine  (LIDODERM ) 5 % Place 1 patch onto the skin daily as needed (pain). Remove & Discard patch within 12 hours or as directed by MD     oxybutynin (DITROPAN) 5 MG tablet Take 5 mg by mouth 3 (three) times daily. (Patient not taking: Reported on 10/16/2023)     sulfamethoxazole-trimethoprim (BACTRIM DS) 800-160 MG tablet Take 1 tablet by mouth 2 (two) times daily. (Patient not taking: Reported on 10/16/2023)     WEGOVY 2.4 MG/0.75ML SOAJ       No results found for this or any previous visit (from the past 48 hours). No results found.  Review of Systems  All other systems reviewed and are negative.   Blood pressure 119/85, pulse 60, temperature 98.1 F (36.7 C), resp. rate 15, SpO2 97%. Physical Exam  GENERAL: The patient is AO x3, in no  acute distress. HEENT: Head is normocephalic and atraumatic. EOMI are intact. Mouth is well hydrated and without lesions. NECK: Supple. No masses LUNGS: Clear to auscultation. No presence of rhonchi/wheezing/rales. Adequate chest expansion HEART: RRR, normal s1 and s2. ABDOMEN: Soft, nontender, no guarding, no peritoneal signs, and nondistended. BS +. No masses. EXTREMITIES: Without any cyanosis, clubbing, rash, lesions or edema. NEUROLOGIC: AOx3, no focal motor deficit. SKIN: no jaundice, no rashes  Assessment/Plan 58 year old male with past medical history of hypertension, OSA, hyperlipidemia, coming for history of colonic polyps.  Will proceed with colonoscopy.  Urban Garden, MD 12/16/2023, 8:18 AM

## 2023-12-16 NOTE — Anesthesia Procedure Notes (Signed)
 Date/Time: 12/16/2023 8:19 AM  Performed by: Verline Glow, CRNAPre-anesthesia Checklist: Patient identified, Emergency Drugs available, Suction available, Timeout performed and Patient being monitored Patient Re-evaluated:Patient Re-evaluated prior to induction Oxygen Delivery Method: Nasal cannula Comments: Optiflow

## 2023-12-16 NOTE — Op Note (Signed)
 [facility] Patient Name: Alan Armstrong Procedure Date: 12/16/2023 8:08 AM MRN: [id number] Date of Birth: 29-May-1966 Attending MD: Samantha Cress , , [id number] CSN: [id number] Age: 58 Admit Type: Outpatient Procedure:                Colonoscopy Indications:              High risk colon cancer surveillance: Personal                            history of sessile serrated colon polyp (less than                            10 mm in size) with no dysplasia Providers:                Samantha Cress, Karyle Pagoda, RN, Sharlette Dayhoff                            Technician, Technician Referring MD:              Medicines:                Monitored Anesthesia Care Complications:            No immediate complications. Estimated Blood Loss:     Estimated blood loss: none. Procedure:                Pre-Anesthesia Assessment:                           - Prior to the procedure, a History and Physical                            was performed, and patient medications, allergies                            and sensitivities were reviewed. The patient's                            tolerance of previous anesthesia was reviewed.                           - The risks and benefits of the procedure and the                            sedation options and risks were discussed with the                            patient. All questions were answered and informed                            consent was obtained.                           - ASA Grade Assessment: II - A patient with mild  systemic disease.                           After obtaining informed consent, the colonoscope                            was passed under direct vision. Throughout the                            procedure, the patient's blood pressure, pulse, and                            oxygen saturations were monitored continuously. The                            [id number]-HQ190L ([id number]) scope was introduced through                             the anus and advanced to the the cecum, identified                            by appendiceal orifice and ileocecal valve. The                            colonoscopy was performed without difficulty. The                            patient tolerated the procedure well. The quality                            of the bowel preparation was good. Scope In: 8:26:19 AM Scope Out: 9:08:30 AM Scope Withdrawal Time: 0 hours 19 minutes 52 seconds  Total Procedure Duration: 0 hours 42 minutes 11 seconds  Findings:      The perianal and digital rectal examinations were normal.      Six sessile polyps were found in the transverse colon, ascending colon       and cecum. The polyps were 2 to 8 mm in size. These polyps were removed       with a cold snare. Resection and retrieval were complete.      Two sessile polyps were found in the descending colon. One of the polyps       was hemorrhagic and had a fibrotic base. The polyps were 6 to 10 mm in       size. These polyps were removed with a cold snare. Resection and       retrieval were complete. Area was tattooed with an injection of 1 mL of       [place] ink to mark the area of largest polypectomy.      A 3 mm polyp was found in the rectum. The polyp was sessile. The polyp       was removed with a cold snare. Resection and retrieval were complete.      Scattered medium-mouthed and small-mouthed diverticula were found in the       sigmoid colon, descending colon and ascending colon.      Non-bleeding internal hemorrhoids were found during retroflexion. The  hemorrhoids were medium-sized. Impression:               - Six 2 to 8 mm polyps in the transverse colon, in                            the ascending colon and in the cecum, removed with                            a cold snare. Resected and retrieved.                           - Two 6 to 10 mm polyps in the descending colon,                            removed with a cold  snare. Resected and retrieved.                            Tattooed.                           - One 3 mm polyp in the rectum, removed with a cold                            snare. Resected and retrieved.                           - Diverticulosis in the sigmoid colon, in the                            descending colon and in the ascending colon.                           - Non-bleeding internal hemorrhoids. Moderate Sedation:      Per Anesthesia Care Recommendation:           - Discharge patient to home (ambulatory).                           - Resume previous diet.                           - Await pathology results.                           - Repeat colonoscopy for surveillance based on                            pathology results. Procedure Code(s):        --- Professional ---                           [phone], Colonoscopy, flexible; with removal of                            tumor(s), polyp(s), or other lesion(s) by snare  technique                           [id number], Colonoscopy, flexible; with directed                            submucosal injection(s), any substance Diagnosis Code(s):        --- Professional ---                           Z86.010, Personal history of colonic polyps                           D12.3, Benign neoplasm of transverse colon (hepatic                            flexure or splenic flexure)                           D12.2, Benign neoplasm of ascending colon                           D12.0, Benign neoplasm of cecum                           D12.4, Benign neoplasm of descending colon                           D12.8, Benign neoplasm of rectum                           K64.8, Other hemorrhoids                           K57.30, Diverticulosis of large intestine without                            perforation or abscess without bleeding CPT copyright 2022 [employer]. All rights reserved. The codes documented in this report  are preliminary and upon coder review may  be revised to meet current compliance requirements. Samantha Cress, MD Samantha Cress,  12/16/2023 9:27:43 AM This report has been signed electronically. Number of Addenda: 0

## 2023-12-17 ENCOUNTER — Encounter (INDEPENDENT_AMBULATORY_CARE_PROVIDER_SITE_OTHER): Payer: Self-pay | Admitting: *Deleted

## 2023-12-17 ENCOUNTER — Encounter (HOSPITAL_COMMUNITY): Payer: Self-pay | Admitting: Gastroenterology

## 2023-12-17 LAB — SURGICAL PATHOLOGY

## 2023-12-23 ENCOUNTER — Encounter (INDEPENDENT_AMBULATORY_CARE_PROVIDER_SITE_OTHER): Payer: Self-pay | Admitting: *Deleted

## 2024-02-23 ENCOUNTER — Ambulatory Visit: Admitting: Orthopedic Surgery

## 2024-03-08 ENCOUNTER — Ambulatory Visit: Admitting: Orthopedic Surgery

## 2024-06-02 ENCOUNTER — Encounter (INDEPENDENT_AMBULATORY_CARE_PROVIDER_SITE_OTHER): Payer: Self-pay | Admitting: Gastroenterology
